# Patient Record
Sex: Female | Born: 1945 | Race: White | Hispanic: No | Smoking: Never smoker
Health system: Southern US, Community
[De-identification: ages and names within clinical notes are randomized; demographics above are authoritative.]

## PROBLEM LIST (undated history)

## (undated) DIAGNOSIS — I4892 Unspecified atrial flutter: Secondary | ICD-10-CM

## (undated) DIAGNOSIS — I480 Paroxysmal atrial fibrillation: Secondary | ICD-10-CM

## (undated) DIAGNOSIS — M109 Gout, unspecified: Secondary | ICD-10-CM

## (undated) DIAGNOSIS — I10 Essential (primary) hypertension: Secondary | ICD-10-CM

## (undated) DIAGNOSIS — E559 Vitamin D deficiency, unspecified: Secondary | ICD-10-CM

## (undated) DIAGNOSIS — A630 Anogenital (venereal) warts: Secondary | ICD-10-CM

## (undated) HISTORY — DX: Paroxysmal atrial fibrillation: I48.0

## (undated) HISTORY — DX: Unspecified atrial flutter: I48.92

## (undated) HISTORY — DX: Gout, unspecified: M10.9

## (undated) HISTORY — DX: Vitamin D deficiency, unspecified: E55.9

## (undated) HISTORY — DX: Anogenital (venereal) warts: A63.0

## (undated) HISTORY — PX: TUBAL LIGATION: SHX77

## (undated) HISTORY — PX: TONSILLECTOMY: SUR1361

---

## 2000-02-17 ENCOUNTER — Encounter: Payer: Self-pay | Admitting: Obstetrics and Gynecology

## 2000-02-17 ENCOUNTER — Encounter: Admission: RE | Admit: 2000-02-17 | Discharge: 2000-02-17 | Payer: Self-pay | Admitting: Obstetrics and Gynecology

## 2000-08-29 DIAGNOSIS — A630 Anogenital (venereal) warts: Secondary | ICD-10-CM

## 2000-08-29 HISTORY — DX: Anogenital (venereal) warts: A63.0

## 2001-02-23 ENCOUNTER — Encounter: Payer: Self-pay | Admitting: Obstetrics and Gynecology

## 2001-02-23 ENCOUNTER — Encounter: Admission: RE | Admit: 2001-02-23 | Discharge: 2001-02-23 | Payer: Self-pay | Admitting: Obstetrics and Gynecology

## 2001-07-20 ENCOUNTER — Ambulatory Visit (HOSPITAL_BASED_OUTPATIENT_CLINIC_OR_DEPARTMENT_OTHER): Admission: RE | Admit: 2001-07-20 | Discharge: 2001-07-20 | Payer: Self-pay | Admitting: Obstetrics and Gynecology

## 2002-03-12 ENCOUNTER — Encounter: Payer: Self-pay | Admitting: Obstetrics and Gynecology

## 2002-03-12 ENCOUNTER — Encounter: Admission: RE | Admit: 2002-03-12 | Discharge: 2002-03-12 | Payer: Self-pay | Admitting: Obstetrics and Gynecology

## 2003-03-14 ENCOUNTER — Encounter: Payer: Self-pay | Admitting: Family Medicine

## 2003-03-14 ENCOUNTER — Encounter: Admission: RE | Admit: 2003-03-14 | Discharge: 2003-03-14 | Payer: Self-pay | Admitting: Family Medicine

## 2003-10-24 ENCOUNTER — Ambulatory Visit (HOSPITAL_COMMUNITY): Admission: RE | Admit: 2003-10-24 | Discharge: 2003-10-24 | Payer: Self-pay | Admitting: Obstetrics and Gynecology

## 2008-04-18 DIAGNOSIS — E559 Vitamin D deficiency, unspecified: Secondary | ICD-10-CM

## 2008-04-18 HISTORY — DX: Vitamin D deficiency, unspecified: E55.9

## 2011-11-21 ENCOUNTER — Emergency Department (HOSPITAL_COMMUNITY)
Admission: EM | Admit: 2011-11-21 | Discharge: 2011-11-21 | Disposition: A | Discharge status: Home / Self Care | Payer: BC Managed Care – PPO | Attending: Emergency Medicine | Admitting: Emergency Medicine

## 2011-11-21 ENCOUNTER — Emergency Department (HOSPITAL_COMMUNITY): Payer: BC Managed Care – PPO

## 2011-11-21 ENCOUNTER — Encounter (HOSPITAL_COMMUNITY): Payer: Self-pay | Admitting: *Deleted

## 2011-11-21 ENCOUNTER — Other Ambulatory Visit: Payer: Self-pay

## 2011-11-21 DIAGNOSIS — M549 Dorsalgia, unspecified: Secondary | ICD-10-CM | POA: Insufficient documentation

## 2011-11-21 DIAGNOSIS — R079 Chest pain, unspecified: Secondary | ICD-10-CM | POA: Insufficient documentation

## 2011-11-21 DIAGNOSIS — I10 Essential (primary) hypertension: Secondary | ICD-10-CM | POA: Insufficient documentation

## 2011-11-21 DIAGNOSIS — R0602 Shortness of breath: Secondary | ICD-10-CM | POA: Insufficient documentation

## 2011-11-21 HISTORY — DX: Essential (primary) hypertension: I10

## 2011-11-21 LAB — BASIC METABOLIC PANEL
Calcium: 9.3 mg/dL (ref 8.4–10.5)
Chloride: 106 mEq/L (ref 96–112)
Creatinine, Ser: 0.78 mg/dL (ref 0.50–1.10)
GFR calc Af Amer: >90 mL/min (ref >90)
Sodium: 143 mEq/L (ref 135–145)

## 2011-11-21 LAB — D-DIMER, QUANTITATIVE: D-Dimer, Quant: 0.38 ug/mL-FEU (ref 0.00–0.48)

## 2011-11-21 LAB — CBC
Hemoglobin: 12.8 g/dL (ref 12.0–15.0)
MCHC: 32.7 g/dL (ref 30.0–36.0)
WBC: 8.8 10*3/uL (ref 4.0–10.5)

## 2011-11-21 LAB — TROPONIN I: Troponin I: <0.3 ng/mL (ref <0.30)

## 2011-11-21 MED ORDER — ASPIRIN 325 MG PO TABS
325.0000 mg | ORAL_TABLET | Freq: Once | ORAL | Status: AC
  Administered 2011-11-21: 325 mg via ORAL
  Filled 2011-11-21: qty 1

## 2011-11-21 MED ORDER — IOHEXOL 350 MG/ML SOLN
100.0000 mL | Freq: Once | INTRAVENOUS | Status: AC | PRN
  Administered 2011-11-21: 100 mL via INTRAVENOUS

## 2011-11-21 MED ORDER — SODIUM CHLORIDE 0.9 % IV SOLN
INTRAVENOUS | Status: DC
  Administered 2011-11-21: 18:00:00 via INTRAVENOUS

## 2011-11-21 MED ORDER — HYDROCODONE-ACETAMINOPHEN 5-325 MG PO TABS: 1.0000 | ORAL_TABLET | Freq: Four times a day (QID) | ORAL | Status: AC | PRN

## 2011-11-21 NOTE — Discharge Instructions (Signed)
Chest pain workup negative cardiac marker negative EKG without acute changes chest x-ray without pneumonia or pneumothorax CT angiogram without evidence of pulmonary embolism. Recommend starting a baby aspirin a day and further followup for the chest pain with your primary care doctor in the next few days. Return for new worse symptoms.Aspirin and Your Heart Aspirin affects the way your blood clots and helps "thin" the blood. Aspirin has many uses in heart disease. It may be used as a primary prevention to help reduce the risk of heart related events. It also can be used as a secondary measure to prevent more heart attacks or to prevent additional damage from blood clots.  ASPIRIN MAY HELP IF YOU:  Have had a heart attack or chest pain.   Have undergone open heart surgery such as CABG (Coronary Artery Bypass Surgery).   Have had coronary angioplasty with or without stents.   Have experienced a stroke or TIA (transient ischemic attack).   Have peripheral vascular disease (PAD).   Have chronic heart rhythm problems such as atrial fibrillation.   Are at risk for heart disease.  BEFORE STARTING ASPIRIN Before you start taking aspirin, your caregiver will need to review your medical history. Many things will need to be taken into consideration, such as:  Smoking status.   Blood pressure.   Diabetes.   Gender.   Weight.   Cholesterol level.  ASPIRIN DOSES  Aspirin should only be taken on the advice of your caregiver. Talk to your caregiver about how much aspirin you should take. Aspirin comes in different doses such as:   81 mg.   162 mg.   325 mg.   The aspirin dose you take may be affected by many factors, some of which include:   Your current medications, especially if your are taking blood-thinners or anti-platelet medicine.   Liver function.   Heart disease risk.   Age.   Aspirin comes in two forms:   Non-enteric-coated. This type of aspirin does not have a coating  and is absorbed faster. Non-enteric coated aspirin is recommended for patients experiencing chest pain symptoms. This type of aspirin also comes in a chewable form.   Enteric-coated. This means the aspirin has a special coating that releases the medicine very slowly. Enteric-coated aspirin causes less stomach upset. This type of aspirin should not be chewed or crushed.  ASPIRIN SIDE EFFECTS Daily use of aspirin can increase your risk of serious side effects, some of these include:  Increased bleeding. This can range from a cut that does not stop bleeding to more serious problems such as stomach bleeding or bleeding into the brain (Intracerebral bleeding).   Increased bruising.   Stomach upset.   An allergic reaction such as red, itchy skin.   Increased risk of bleeding when combined with non-steroidal anti-inflammatory medicine (NSAIDS).   Alcohol should be drank in moderation when taking aspirin. Alcohol can increase the risk of stomach bleeding when taken with aspirin.   Aspirin should not be given to children less than 16 years of age due to the association of Reye syndrome. Reye syndrome is a serious illness that can affect the brain and liver. Studies have linked Reye syndrome with aspirin use in children.   People that have nasal polyps have an increased risk of developing an aspirin allergy.  SEEK MEDICAL CARE IF:   You develop an allergic reaction such as:   Hives.   Itchy skin.   Swelling of the lips, tongue or face.  You develop stomach pain.   You have unusual bleeding or bruising.   You have ringing in your ears.  SEEK IMMEDIATE MEDICAL CARE IF:   You have severe chest pain, especially if the pain is crushing or pressure-like and spreads to the arms, back, neck, or jaw. THIS IS AN EMERGENCY. Do not wait to see if the pain will go away. Get medical help at once. Call your local emergency services (911 in the [place] DO NOT drive yourself to the hospital.   You have  stroke-like symptoms such as:   Loss of vision.   Difficulty talking.   Numbness or weakness on one side of your body.   Numbness or weakness in your arm or leg.   Not thinking clearly or feeling confused.   Your bowel movements are bloody, dark red or black in color.   You vomit or cough up blood.   You have blood in your urine.   You have shortness of breath, coughing or wheezing.  MAKE SURE YOU:   Understand these instructions.   Will monitor your condition.   Seek immediate medical care if necessary.  Document Released: 07/28/2008 Document Revised: 08/04/2011 Document Reviewed: 07/28/2008 [facility] Patient Information 2012 [place], [place].

## 2011-11-21 NOTE — ED Notes (Signed)
Pt presents to er with mid center chest pain that radiates through to the back area at times. Pain is associated with sob, pain is also increased with deep breathing. Denies any n/v, diaphoresis.

## 2011-11-21 NOTE — ED Provider Notes (Signed)
History   This chart was scribed for Shelda Jakes, MD by Sofie Rower. The patient was seen in room APA14/APA14 and the patient's care was started at 5:08PM.    CSN: [id number]  Arrival date & time 11/21/11  1644   First MD Initiated Contact with Patient 11/21/11 1701      Chief Complaint  Patient presents with  . Chest Pain  . Shortness of Breath    (Consider location/radiation/quality/duration/timing/severity/associated sxs/prior treatment) HPI  Paige Martinez is a 66 y.o. female who presents to the Emergency Department complaining of moderate, constant chest pain located sub sternally left onset yesterday with associated symptoms of shortness of breath, headache, cough, lower back pain. Pt states "pain radiates to the back". Pt rates the pain at 10/10 at worst, 2/10 at present. Modifying factors include deep breathing, lying on her back which intensifies the pain. Pt has a hx of irregular heartbeat (October 2012), hx of bronchitis which the pt just finished antibiotics.    Pt denies radiating pain to the neck or jaw, nausea, vomiting, any similar symptoms previously, fever, neck pain, dysuria, swelling in the legs, rash.   PCP is [facility].   Past Medical History  Diagnosis Date  . Hypertension     Past Surgical History  Procedure Date  . Tubal ligation     No family history on file.  History  Substance Use Topics  . Smoking status: Never Smoker   . Smokeless tobacco: Not on file  . Alcohol Use: No    OB History    Grav Para Term Preterm Abortions TAB SAB Ect Mult Living                  Review of Systems  All other systems reviewed and are negative.    10 Systems reviewed and are negative for acute change except as noted in the HPI.   Allergies  Review of patient's allergies indicates no known allergies.  Home Medications   Current Outpatient Rx  Name Route Sig Dispense Refill  . VITAMIN D 2000 UNITS PO CAPS Oral Take 1 capsule by mouth  every morning.    Marland Kitchen LISINOPRIL 10 MG PO TABS Oral Take 10 mg by mouth every morning.      BP 146/74  Pulse 62  Temp(Src) 98.1 F (36.7 C) (Oral)  Resp 18  Ht 5' 10.5" (1.791 m)  Wt 185 lb (83.915 kg)  BMI 26.17 kg/m2  SpO2 96%  Physical Exam  Nursing note and vitals reviewed. Constitutional: She is oriented to person, place, and time. She appears well-developed and well-nourished.  HENT:  Head: Normocephalic and atraumatic.  Right Ear: External ear normal.  Left Ear: External ear normal.  Nose: Nose normal.  Eyes: Conjunctivae and EOM are normal. No scleral icterus.  Neck: Neck supple. No thyromegaly present.  Cardiovascular: Normal rate, regular rhythm and normal heart sounds.  Exam reveals no gallop and no friction rub.   No murmur heard. Pulmonary/Chest: Breath sounds normal. No stridor. She has no wheezes. She has no rales. She exhibits no tenderness.  Abdominal: Soft. Bowel sounds are normal. She exhibits no distension. There is no tenderness. There is no rebound.  Musculoskeletal: Normal range of motion. She exhibits no edema.  Lymphadenopathy:    She has no cervical adenopathy.  Neurological: She is oriented to person, place, and time. Coordination normal.  Skin: Skin is warm and dry. No rash noted. No erythema.  Psychiatric: She has a normal mood and  affect. Her behavior is normal.    ED Course  Procedures (including critical care time)  DIAGNOSTIC STUDIES: Oxygen Saturation is 96% on room air, normal by my interpretation.    COORDINATION OF CARE:   Date: 11/21/2011  Rate: 59  Rhythm: sinus bradycardia  QRS Axis: normal  Intervals: normal  ST/T Wave abnormalities: nonspecific ST changes  Conduction Disutrbances:none  Narrative Interpretation:   Old EKG Reviewed: unchanged EKG is unchanged from 07/20/2001  Results for orders placed during the hospital encounter of 11/21/11  CBC      Component Value Range   WBC 8.8  4.0 - 10.5 (K/uL)   RBC 4.46  3.87 -  5.11 (MIL/uL)   Hemoglobin 12.8  12.0 - 15.0 (g/dL)   HCT 16.1  09.6 - 04.5 (%)   MCV 87.9  78.0 - 100.0 (fL)   MCH 28.7  26.0 - 34.0 (pg)   MCHC 32.7  30.0 - 36.0 (g/dL)   RDW 40.9  81.1 - 91.4 (%)   Platelets 279  150 - 400 (K/uL)  TROPONIN I      Component Value Range   Troponin I <0.30  <0.30 (ng/mL)  D-DIMER, QUANTITATIVE      Component Value Range   D-Dimer, Quant 0.38  0.00 - 0.48 (ug/mL-FEU)  BASIC METABOLIC PANEL      Component Value Range   Sodium 143  135 - 145 (mEq/L)   Potassium 3.7  3.5 - 5.1 (mEq/L)   Chloride 106  96 - 112 (mEq/L)   CO2 28  19 - 32 (mEq/L)   Glucose, Bld 97  70 - 99 (mg/dL)   BUN 11  6 - 23 (mg/dL)   Creatinine, Ser 7.82  0.50 - 1.10 (mg/dL)   Calcium 9.3  8.4 - 95.6 (mg/dL)   GFR calc non Af Amer 86 (*) >90 (mL/min)   GFR calc Af Amer >90  >90 (mL/min)   Dg Chest 2 View  11/21/2011  *RADIOLOGY REPORT*  Clinical Data: Chest pain and shortness of breath  CHEST - 2 VIEW  Comparison: None  Findings: Heart size is normal.  No pleural effusion or pulmonary edema.  No airspace consolidation.  Scar versus plate-like atelectasis is noted in the left base.  IMPRESSION:  1.  Left base scar versus atelectasis. 2.  No pneumonia.  Original Report Authenticated By: Rosealee Albee, M.D.   Ct Angio Chest W/cm &/or Wo Cm  11/21/2011  *RADIOLOGY REPORT*  Clinical Data: Constant, substernal chest pain since yesterday. Shortness of breath.  Pain radiating into the back.  Hypertension.  CT ANGIOGRAPHY CHEST  Technique:  Multidetector CT imaging of the chest using the standard protocol during bolus administration of intravenous contrast. Multiplanar reconstructed images including MIPs were obtained and reviewed to evaluate the vascular anatomy.  Contrast: OMNIPAQUE IOHEXOL 350 MG/ML IV SOLN  Comparison: Chest x-ray 11/21/2011  Findings: Pulmonary arteries are well opacified.  There is no evidence for acute pulmonary embolus.  The heart size is normal. No mediastinal,  hilar, or axillary adenopathy.  The visualized portion of the thyroid gland has a normal appearance.  No pulmonary nodules, pleural effusions, or infiltrates.  Benign right adrenal myelolipoma or adenoma identified on the upper the cuts of the abdomen. Visualized osseous structures have a normal appearance.  IMPRESSION:  1.  Technically adequate exam showing no evidence for acute pulmonary embolus. 2.  Incidental note of a benign right adrenal adenoma or myelolipoma.  Original Report Authenticated By: Patterson Hammersmith, M.D.  1. Chest pain     5:12PM- EDP at bedside discusses treatment plan.   6:54PM- EDP at bedside discusses treatment plan.   MDM   Workup for chest pain in the emergency department without any sniffing findings EKG without acute cardiac changes troponin negative patient's chest pain started yesterday. CT angiogram of the chest pain radiated to the back that's negative for any abnormalities findings no evidence of an embolism regular x-ray negative for pneumonia or pneumothorax as well. Should etiology of chest pain is not clear but it is chest wall or pleuritic in nature. We'll recommend a baby aspirin a day and will treat patient with hydrocodone as needed for more severe pain however pain has been improving here. Patient will followup with her primary care doctor in the next few days.        I personally performed the services described in this documentation, which was scribed in my presence. The recorded information has been reviewed and considered.        Shelda Jakes, MD 11/21/11 2030

## 2013-06-05 ENCOUNTER — Encounter: Payer: Self-pay | Admitting: Family Medicine

## 2013-06-27 ENCOUNTER — Ambulatory Visit (INDEPENDENT_AMBULATORY_CARE_PROVIDER_SITE_OTHER): Payer: BC Managed Care – PPO | Admitting: Physician Assistant

## 2013-06-27 ENCOUNTER — Telehealth: Payer: Self-pay | Admitting: Physician Assistant

## 2013-06-27 ENCOUNTER — Other Ambulatory Visit: Payer: Self-pay | Admitting: Physician Assistant

## 2013-06-27 ENCOUNTER — Encounter: Payer: Self-pay | Admitting: Physician Assistant

## 2013-06-27 VITALS — BP 122/80 | HR 60 | Temp 97.7°F | Resp 18 | Ht 69.0 in | Wt 191.0 lb

## 2013-06-27 DIAGNOSIS — Z0000 Encounter for general adult medical examination without abnormal findings: Secondary | ICD-10-CM

## 2013-06-27 DIAGNOSIS — E559 Vitamin D deficiency, unspecified: Secondary | ICD-10-CM

## 2013-06-27 DIAGNOSIS — Z23 Encounter for immunization: Secondary | ICD-10-CM

## 2013-06-27 DIAGNOSIS — I10 Essential (primary) hypertension: Secondary | ICD-10-CM

## 2013-06-27 DIAGNOSIS — Z1239 Encounter for other screening for malignant neoplasm of breast: Secondary | ICD-10-CM

## 2013-06-27 DIAGNOSIS — Z01419 Encounter for gynecological examination (general) (routine) without abnormal findings: Secondary | ICD-10-CM

## 2013-06-27 LAB — BASIC METABOLIC PANEL WITH GFR
Calcium: 9.7 mg/dL (ref 8.4–10.5)
GFR, Est African American: >89 mL/min
Sodium: 141 mEq/L (ref 135–145)

## 2013-06-27 NOTE — Telephone Encounter (Signed)
Patient needs her Lisinopril refilled.    [facility]

## 2013-06-27 NOTE — Progress Notes (Signed)
Patient ID: Paige Martinez MRN: [id number], DOB: 10-15-1945, 67 y.o. Date of Encounter: 06/27/2013,   Chief Complaint: Physical (CPE) coded as a level IV office visit as patient is Medicare.  HPI: 67 y.o. y/o female  here for CPE.  Coded as a level IV office visit as patient is Medicare.  Patient states that she has been feeling well over the past year. She has not had to see any type of medical care for the entire year. She is taking her vitamin D 1000 units daily and her blood pressure pill as directed. She is having no adverse effects.   Review of Systems: Consitutional: No fever, chills, fatigue, night sweats, lymphadenopathy. No significant/unexplained weight changes. Eyes: No visual changes, eye redness, or discharge. ENT/Mouth: No ear pain, sore throat, nasal drainage, or sinus pain. Cardiovascular: No chest pressure,heaviness, tightness or squeezing, even with exertion. No increased shortness of breath or dyspnea on exertion.No palpitations, edema, orthopnea, PND. Respiratory: No cough, hemoptysis, SOB, or wheezing. Gastrointestinal: No anorexia, dysphagia, reflux, pain, nausea, vomiting, hematemesis, diarrhea, constipation, BRBPR, or melena. Breast: No mass, nodules, bulging, or retraction. No skin changes or inflammation. No nipple discharge. No lymphadenopathy. Genitourinary: No dysuria, hematuria, incontinence, vaginal discharge, pruritis, burning, abnormal bleeding, or pain. Musculoskeletal: No decreased ROM, No joint pain or swelling. No significant pain in neck, back, or extremities. Skin: No rash, pruritis, or concerning lesions. Neurological: No headache, dizziness, syncope, seizures, tremors, memory loss, coordination problems, or paresthesias. Psychological: No anxiety, depression, hallucinations, SI/HI. Endocrine: No polydipsia, polyphagia, polyuria, or known diabetes.No increased fatigue. No palpitations/rapid heart rate. No significant/unexplained weight  change. All other systems were reviewed and are otherwise negative.  Past Medical History  Diagnosis Date  . Hypertension   . Vitamin D deficiency 04/18/2008  . 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 Tubal ligation      Home Meds:  Current Outpatient Prescriptions on File Prior to Visit  Medication Sig Dispense Refill  . lisinopril (PRINIVIL,ZESTRIL) 10 MG tablet Take 10 mg by mouth every morning.       No current facility-administered medications on file prior to visit.    Allergies: No Known Allergies  History   Social History  . Marital Status: Divorced    Spouse Name: N/A    Number of Children: N/A  . 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 Smoking status: Never Smoker   . Smokeless tobacco: Not on file  . Alcohol Use: No  . Drug Use: No  . Sexual Activity: Not on file   Other Topics Concern  . Not on file   Social History Narrative   Divorced.    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   Rides Stationary Bike 4 miles 4 days per week. --20 minutes    Family History  Problem Relation Age of Onset  . Alcohol abuse Father   . Cancer Father 13    Pancreatic Cancer  . Hypertension Sister   . Hypertension Brother     Physical Exam: Blood pressure 122/80, pulse 60, temperature 97.7 F (36.5 C), temperature source Oral, resp. rate 18, height 5\' 9"  (1.753 m), weight 191 lb (86.637 kg)., Body mass index is 28.19 kg/(m^2). General: Overweight white female. Appears in no acute distress. Neck: Supple. Trachea midline. No thyromegaly. Full ROM.  No lymphadenopathy.No Carotid Bruits. Lungs: Clear to auscultation bilaterally without wheezes, rales, or rhonchi. Breathing is of normal effort and unlabored. Cardiovascular: RRR with S1 S2. No murmurs, rubs, or gallops. Distal pulses 2+ symmetrically. No carotid or  abdominal bruits. Breast: Symmetrical. No masses. Nipples without discharge. Abdomen: Soft, non-tender, non-distended with normoactive bowel sounds. No hepatosplenomegaly or masses. No rebound/guarding. No CVA tenderness. No hernias.  Genitourinary:  External genitalia without lesions. Vaginal mucosa pink.No discharge present. Cervix pink and without discharge. No cervical tenderness.Normal uterus size. No adnexal mass or tenderness.  Musculoskeletal: Full range of motion and 5/5 strength throughout. Without swelling, atrophy, tenderness, crepitus, or warmth. Extremities without clubbing, cyanosis, or edema. Calves supple. Skin: Warm and moist without erythema, ecchymosis, wounds, or rash. Neuro: A+Ox3. CN II-XII grossly intact. Moves all extremities spontaneously. Full sensation throughout. Normal gait. Psych:  Responds to questions appropriately with a normal affect.   Assessment/Plan:  67 y.o. y/o female here for CPE 1. Hypertension Blood pressure at goal at 122/80 today. Cont current  medication and check labs monitor. - BASIC METABOLIC PANEL WITH GFR  2. Vitamin D deficiency History of vitamin D deficiency. 04/18/2008 vitamin D level was 17.8. She has been treated with supplementation since that time. Currently taking 1000 units daily. We'll recheck level to monitor. Last several checks have been within normal limits on this dose. - Vit D  25 hydroxy (rtn osteoporosis monitoring)  3. Screening breast examination, BSE discussion Discussed monthly self examination to check for any changes in self exam. Discussed abnormal findings to look for.  4. Breast cancer screening She reports that she had routine mammogram October 2013. At that mammogram she was told to followup in 6 months. However she did not follow up until September 2014. Says at that time she had an additional ultrasound as well as 3-D mammogram. She states she was told that the prior spot they were looking at had not changed.  However they saw a new spot on this one so they recommended repeat exam in 6 months again. However she says that they suspected that this areas of concern are most likely  cysts.  5. Visit for pelvic exam Pelvic exam is normal. She had a Pap smear October 2013 which was normal. Therefore repeat is not due yet.   A. Screening Labs: She had a full panel of screening lab work October 2013 which was all normal. Cholesterol was good. Therefore not recheck these labs.  B. Pap: Last Pap was October 2013 was normal.  C. Screening Mammogram: See #4 above.  E. Colorectal Cancer Screening: She had colonoscopy 2009. Normal. Repeat 10 years. Due to repeat 2019.  F. Immunizations:  Influenza: She reports that she already had one for this year on 05/29/2013. Tetanus: This is over due and she is agreeable to have this done today. Update now. Pneumococcal: Administered here 04/17/2009. She will need no further pneumococcal vaccines. Zostavax: She received this 06/10/2011.  Signed, [address] [place], [place], [facility] 06/27/2013 9:03 AM

## 2013-06-27 NOTE — Addendum Note (Signed)
Addended by: Donne Anon on: 06/27/2013 09:31 AM   Modules accepted: Orders

## 2013-06-28 LAB — VITAMIN D 25 HYDROXY (VIT D DEFICIENCY, FRACTURES): Vit D, 25-Hydroxy: 64 ng/mL (ref 30–89)

## 2013-07-01 ENCOUNTER — Encounter: Payer: Self-pay | Admitting: Family Medicine

## 2013-07-02 ENCOUNTER — Other Ambulatory Visit: Payer: Self-pay | Admitting: Physician Assistant

## 2013-07-02 IMAGING — CT CT ANGIO CHEST
2 of 6 series · 6 of 36 positions shown · IV contrast (Omnipaque 300)
Comparison: Chest x-ray 11/21/2011

CLINICAL DATA: Constant, substernal chest pain since yesterday.
Shortness of breath.  Pain radiating into the back.  Hypertension.

CT ANGIOGRAPHY CHEST
TECHNIQUE: Multidetector CT imaging of the chest using the
standard protocol during bolus administration of intravenous
contrast. Multiplanar reconstructed images including MIPs were
obtained and reviewed to evaluate the vascular anatomy.
Contrast: 100mL OMNIPAQUE IOHEXOL 350 MG/ML IV SOLN

[Series 4: pe 3.0 b40f · axial · 0.73mm/px · z∈[-268,-97]mm · 5 of 87 slices shown]
[im 15/87  lung]
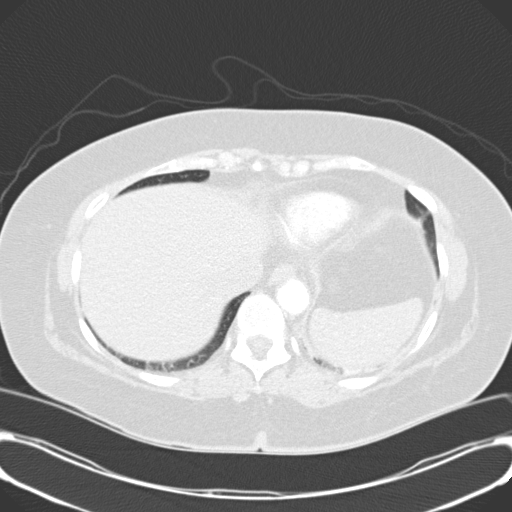
[im 29/87  mediastinal]
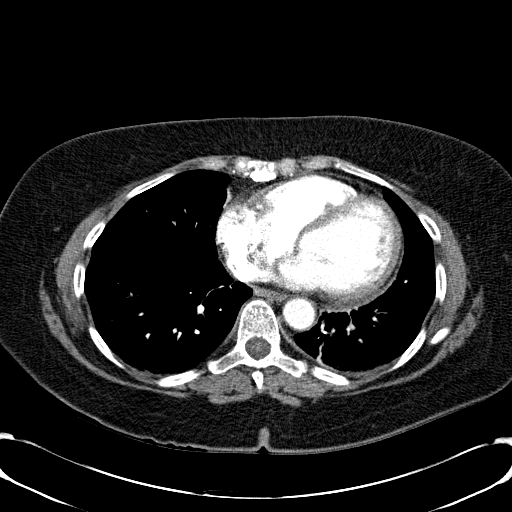
[im 44/87  lung]
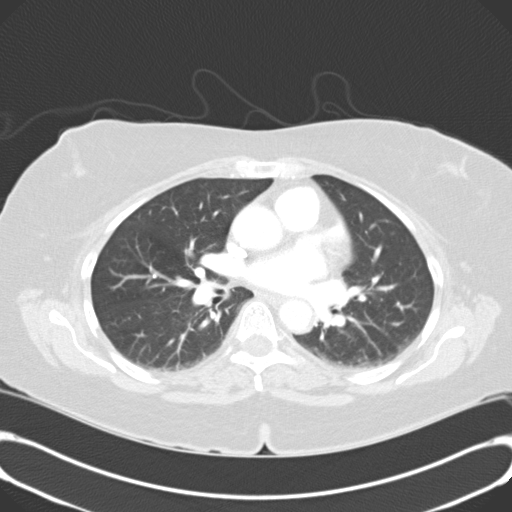
[im 58/87  mediastinal]
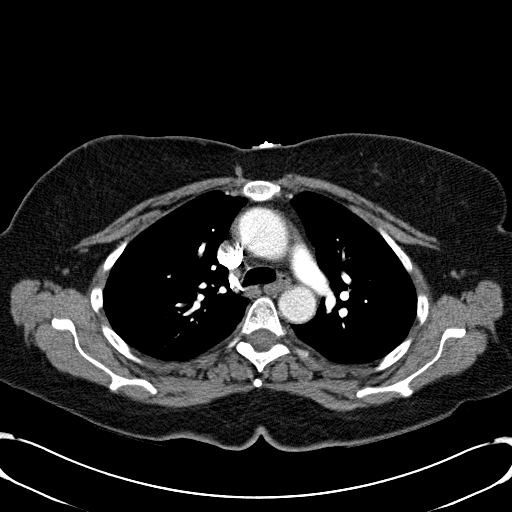
[im 72/87  lung]
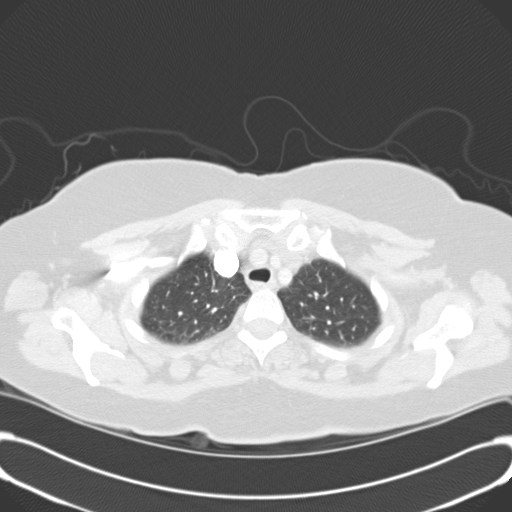

[Series 6: mpr coronal pe 3mm · coronal · 0.52mm/px · 1 of 83 slices shown]
[im 42/83  mediastinal]
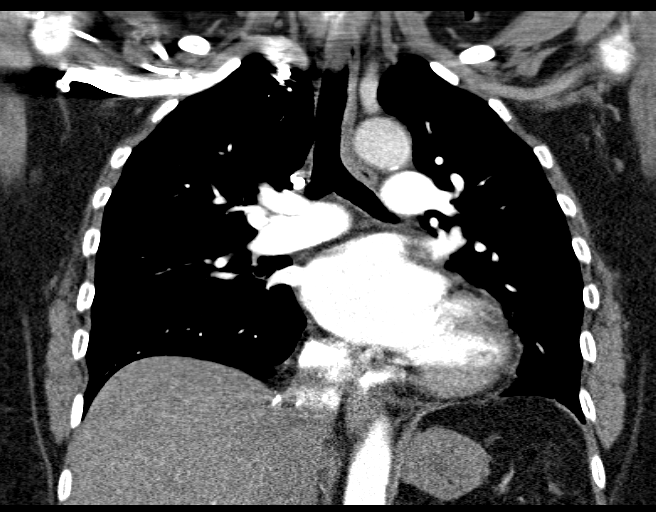

[6 of 36 positions shown; findings below may reference images not displayed]

FINDINGS: Pulmonary arteries are well opacified.  There is no
evidence for acute pulmonary embolus.  The heart size is normal. No
mediastinal, hilar, or axillary adenopathy.  The visualized portion
of the thyroid gland has a normal appearance.  No pulmonary
nodules, pleural effusions, or infiltrates.  Benign right adrenal
myelolipoma or adenoma identified on the upper the cuts of the
abdomen. Visualized osseous structures have a normal appearance.
IMPRESSION: 1.  Technically adequate exam showing no evidence for acute
pulmonary embolus.
2.  Incidental note of a benign right adrenal adenoma or
myelolipoma.

## 2013-07-02 IMAGING — CR DG CHEST 2V
2 series · 2 of 2 positions shown · non-contrast
Comparison: None

CLINICAL DATA: Chest pain and shortness of breath

CHEST - 2 VIEW

[view not recorded (1 of 2)]
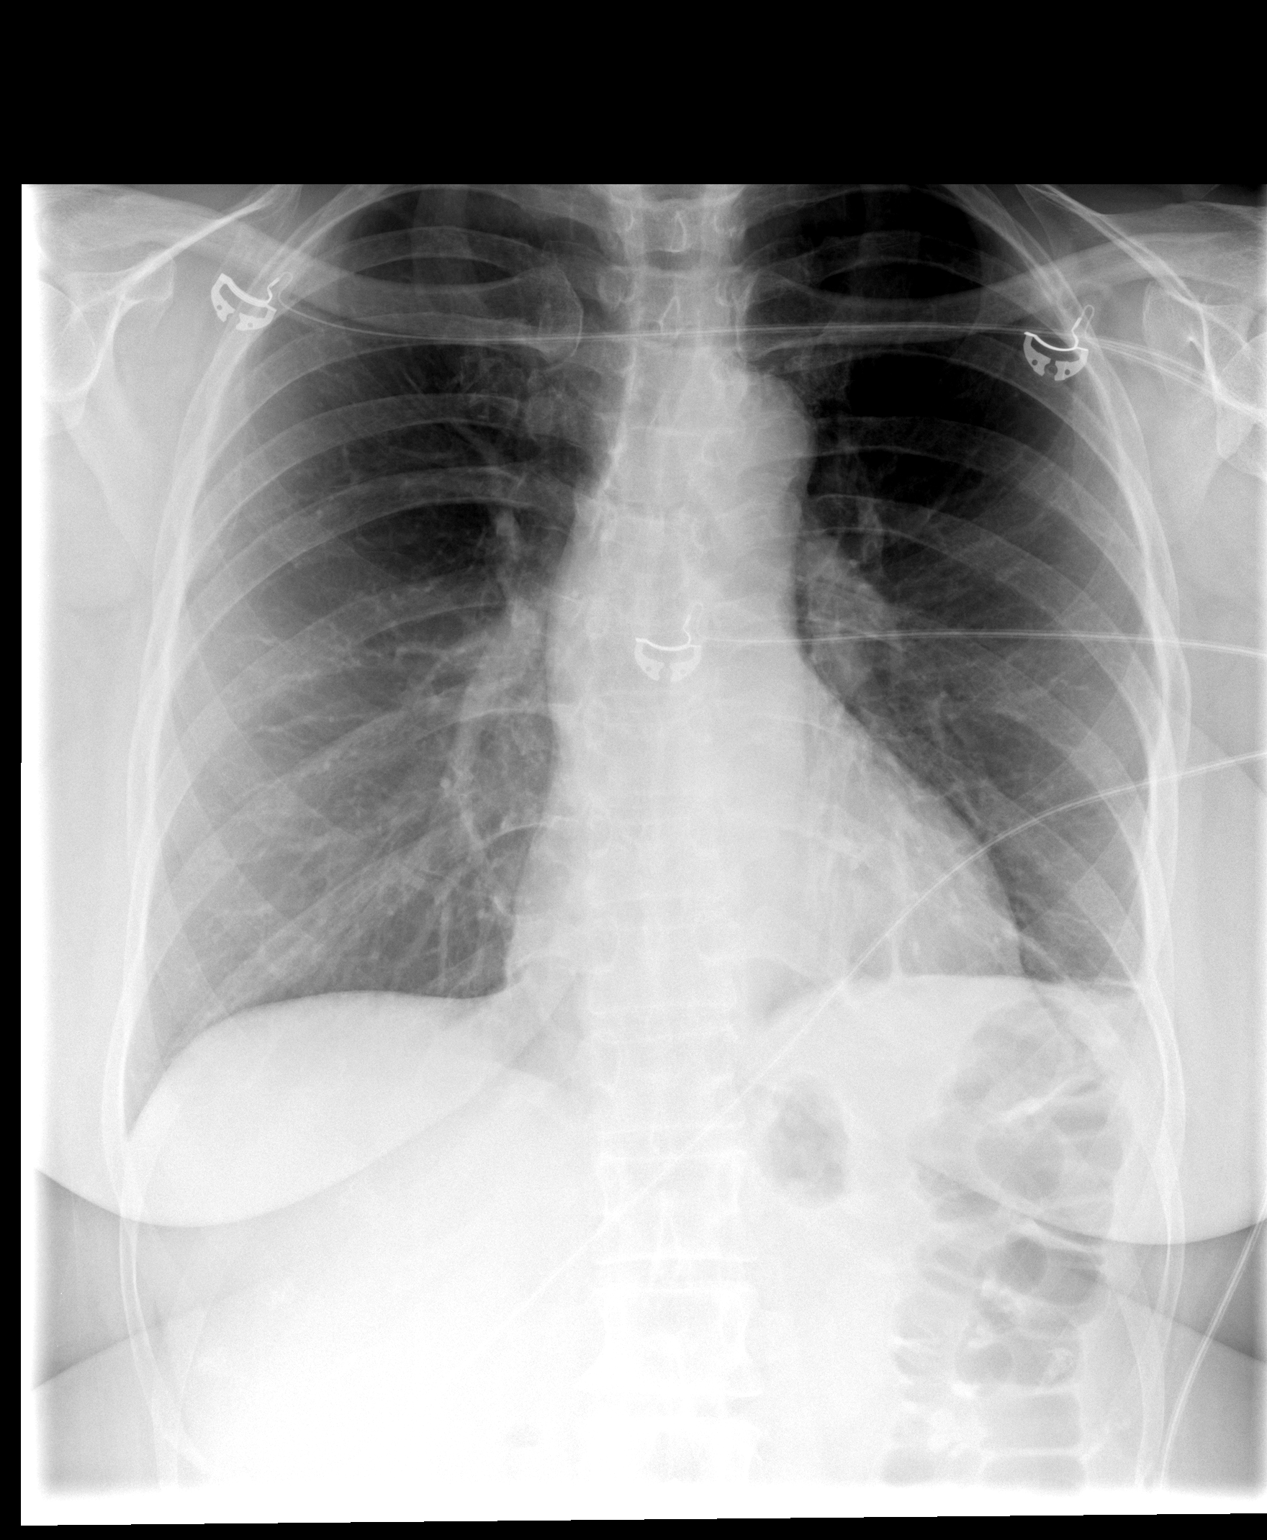

[view not recorded (2 of 2)]
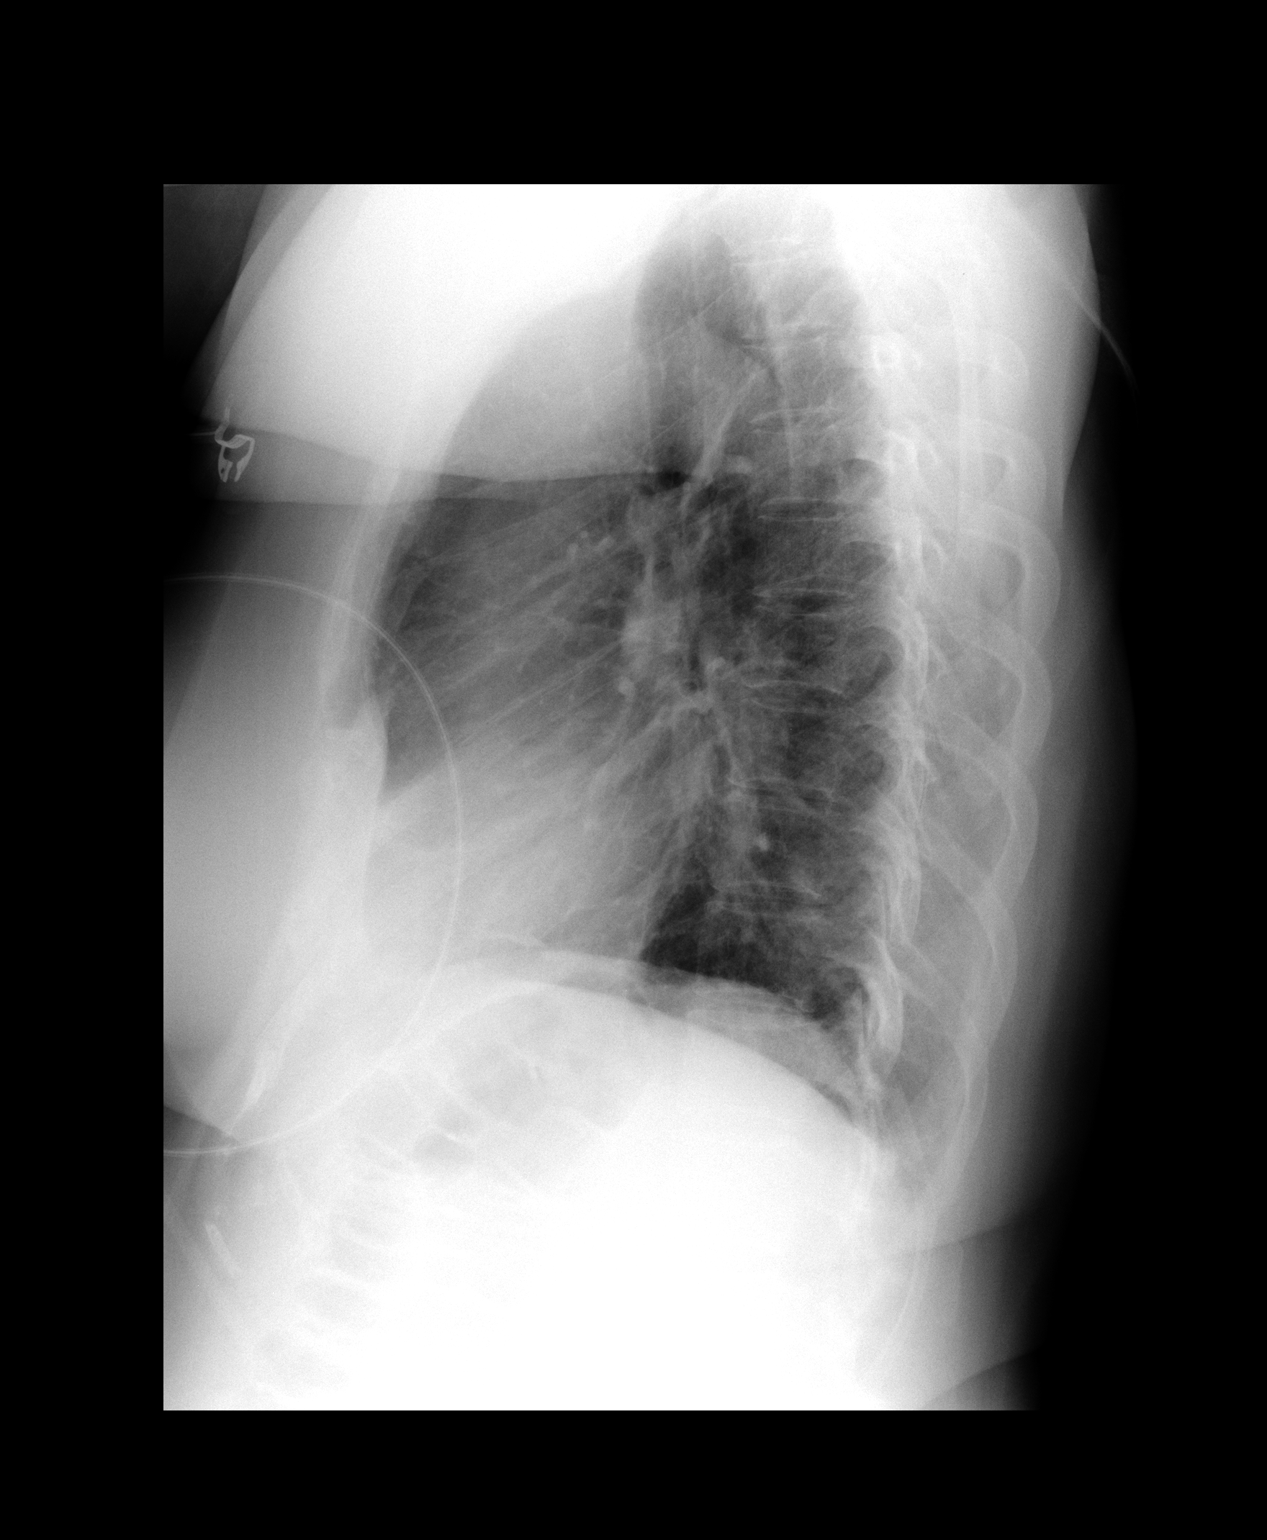

[2 of 2 positions shown; findings below may reference images not displayed]

FINDINGS: Heart size is normal.

No pleural effusion or pulmonary edema.

No airspace consolidation.

Scar versus plate-like atelectasis is noted in the left base.
IMPRESSION: 1.  Left base scar versus atelectasis.
2.  No pneumonia.

## 2013-07-03 NOTE — Telephone Encounter (Signed)
Medication refilled per protocol. 

## 2013-11-15 DIAGNOSIS — N6009 Solitary cyst of unspecified breast: Secondary | ICD-10-CM | POA: Diagnosis not present

## 2013-12-05 ENCOUNTER — Encounter: Payer: Self-pay | Admitting: Family Medicine

## 2013-12-13 DIAGNOSIS — M722 Plantar fascial fibromatosis: Secondary | ICD-10-CM | POA: Diagnosis not present

## 2014-03-25 DIAGNOSIS — M722 Plantar fascial fibromatosis: Secondary | ICD-10-CM | POA: Diagnosis not present

## 2014-03-25 DIAGNOSIS — M7150 Other bursitis, not elsewhere classified, unspecified site: Secondary | ICD-10-CM | POA: Diagnosis not present

## 2014-03-25 DIAGNOSIS — M7730 Calcaneal spur, unspecified foot: Secondary | ICD-10-CM | POA: Diagnosis not present

## 2014-04-01 DIAGNOSIS — M7150 Other bursitis, not elsewhere classified, unspecified site: Secondary | ICD-10-CM | POA: Diagnosis not present

## 2014-04-01 DIAGNOSIS — M722 Plantar fascial fibromatosis: Secondary | ICD-10-CM | POA: Diagnosis not present

## 2014-06-02 ENCOUNTER — Other Ambulatory Visit: Payer: Medicare Other

## 2014-06-02 DIAGNOSIS — E559 Vitamin D deficiency, unspecified: Secondary | ICD-10-CM | POA: Diagnosis not present

## 2014-06-02 DIAGNOSIS — Z79899 Other long term (current) drug therapy: Secondary | ICD-10-CM

## 2014-06-02 DIAGNOSIS — I10 Essential (primary) hypertension: Secondary | ICD-10-CM | POA: Diagnosis not present

## 2014-06-02 DIAGNOSIS — M8580 Other specified disorders of bone density and structure, unspecified site: Secondary | ICD-10-CM

## 2014-06-02 LAB — COMPLETE METABOLIC PANEL WITH GFR
ALBUMIN: 4.1 g/dL (ref 3.5–5.2)
ALT: 12 U/L (ref 0–35)
AST: 16 U/L (ref 0–37)
Alkaline Phosphatase: 114 U/L (ref 39–117)
BUN: 13 mg/dL (ref 6–23)
CALCIUM: 9.4 mg/dL (ref 8.4–10.5)
CHLORIDE: 105 meq/L (ref 96–112)
CO2: 29 meq/L (ref 19–32)
CREATININE: 0.79 mg/dL (ref 0.50–1.10)
GFR, EST AFRICAN AMERICAN: 89 mL/min
GFR, EST NON AFRICAN AMERICAN: 77 mL/min
GLUCOSE: 91 mg/dL (ref 70–99)
POTASSIUM: 4.5 meq/L (ref 3.5–5.3)
Sodium: 141 mEq/L (ref 135–145)
Total Bilirubin: 0.5 mg/dL (ref 0.2–1.2)
Total Protein: 6.2 g/dL (ref 6.0–8.3)

## 2014-06-02 LAB — LIPID PANEL
CHOL/HDL RATIO: 3.1 ratio
CHOLESTEROL: 205 mg/dL — AB (ref 0–200)
HDL: 66 mg/dL (ref >39)
LDL Cholesterol: 122 mg/dL — ABNORMAL HIGH (ref 0–99)
Triglycerides: 85 mg/dL (ref <150)
VLDL: 17 mg/dL (ref 0–40)

## 2014-06-02 LAB — CBC WITH DIFFERENTIAL/PLATELET
BASOS ABS: 0 10*3/uL (ref 0.0–0.1)
Basophils Relative: 0 % (ref 0–1)
EOS PCT: 2 % (ref 0–5)
Eosinophils Absolute: 0.2 10*3/uL (ref 0.0–0.7)
HCT: 39.7 % (ref 36.0–46.0)
Hemoglobin: 13.5 g/dL (ref 12.0–15.0)
LYMPHS ABS: 1.4 10*3/uL (ref 0.7–4.0)
LYMPHS PCT: 18 % (ref 12–46)
MCH: 28.8 pg (ref 26.0–34.0)
MCHC: 34 g/dL (ref 30.0–36.0)
MCV: 84.6 fL (ref 78.0–100.0)
Monocytes Absolute: 0.5 10*3/uL (ref 0.1–1.0)
Monocytes Relative: 7 % (ref 3–12)
NEUTROS PCT: 73 % (ref 43–77)
Neutro Abs: 5.5 10*3/uL (ref 1.7–7.7)
PLATELETS: 306 10*3/uL (ref 150–400)
RBC: 4.69 MIL/uL (ref 3.87–5.11)
RDW: 14.5 % (ref 11.5–15.5)
WBC: 7.5 10*3/uL (ref 4.0–10.5)

## 2014-06-02 LAB — TSH: TSH: 2.874 u[IU]/mL (ref 0.350–4.500)

## 2014-06-03 LAB — VITAMIN D 25 HYDROXY (VIT D DEFICIENCY, FRACTURES): VIT D 25 HYDROXY: 61 ng/mL (ref 30–89)

## 2014-06-05 ENCOUNTER — Ambulatory Visit (INDEPENDENT_AMBULATORY_CARE_PROVIDER_SITE_OTHER): Payer: Medicare Other | Admitting: Physician Assistant

## 2014-06-05 ENCOUNTER — Encounter: Payer: Self-pay | Admitting: Physician Assistant

## 2014-06-05 VITALS — BP 120/80 | HR 52 | Temp 98.3°F | Resp 18 | Ht 69.25 in | Wt 194.0 lb

## 2014-06-05 DIAGNOSIS — I10 Essential (primary) hypertension: Secondary | ICD-10-CM

## 2014-06-05 DIAGNOSIS — Z23 Encounter for immunization: Secondary | ICD-10-CM

## 2014-06-05 DIAGNOSIS — E559 Vitamin D deficiency, unspecified: Secondary | ICD-10-CM

## 2014-06-05 DIAGNOSIS — Z0000 Encounter for general adult medical examination without abnormal findings: Secondary | ICD-10-CM

## 2014-06-05 MED ORDER — LISINOPRIL 10 MG PO TABS: ORAL_TABLET | ORAL | Status: DC

## 2014-06-05 NOTE — Progress Notes (Signed)
Subjective:   Patient presents for Welcome to Physical.  Review Past Medical/Family/Social: These are all documented in Epic and reviewed today.  Risk Factors  Current exercise habits:  Rides stationary bike 4 miles 4 times a week Dietary issues discussed: Discussed lower fat, lower carb diet  Cardiac risk factors: Obesity, Age, HTN  Depression Screen  (Note: if answer to either of the following is "Yes", a more complete depression screening is indicated)  Over the past two weeks, have you felt down, depressed or hopeless? No Over the past two weeks, have you felt little interest or pleasure in doing things? No Have you lost interest or pleasure in daily life? No Do you often feel hopeless? No Do you cry easily over simple problems? No   Activities of Daily Living  In your present state of health, do you have any difficulty performing the following activities?:  Driving? No  Managing money? No  Feeding yourself? No  Getting from bed to chair? No  Climbing a flight of stairs? No  Preparing food and eating?: No  Bathing or showering? No  Getting dressed: No  Getting to the toilet? No  Using the toilet:No  Moving around from place to place: No  In the past year have you fallen or had a near fall?:No  Are you sexually active? No  Do you have more than one partner? No   Hearing Difficulties: No  Do you often ask people to speak up or repeat themselves? No  Do you experience ringing or noises in your ears? No Do you have difficulty understanding soft or whispered voices? No  Do you feel that you have a problem with memory? No Do you often misplace items? No  Do you feel safe at home? Yes  Cognitive Testing  Alert? Yes Normal Appearance?Yes  Oriented to person? Yes Place? Yes  Time? Yes  Recall of three objects? Yes  Can perform simple calculations? Yes  Displays appropriate judgment?Yes  Can read the correct time from a watch face?Yes   List the Names of Other  Physician/Practitioners you currently use:  None  Indicate any recent Medical Services you may have received from other than [facility] providers in the past year (date may be approximate).   Screening Tests / Date Colonoscopy---[facility]---2009--normal--repeat 10 years                     Zostavax ---06/10/2011 Mammogram ---Had U/S 10/2013--Was due for Bilateral Mammogram 05/18/2014--Pt agreeable to schedule herself--at [facility] Influenza Vaccine --Pt agreeable to receive today Tetanus/tdap---06/27/2013    Assessment:    Annual wellness medicare exam   Plan:    During the course of the visit the patient was educated and counseled about appropriate screening and preventive services including:  Screening mammography  Colorectal cancer screening  Shingles vaccine. Prescription given to that she can get the vaccine at the pharmacy or Medicare part D.  Screen + for depression. PHQ- 9 score of 12 (moderate depression). We discussed the options of counseling versus possibly a medication. I encouraged her strongly think about the counseling. She is going through some medical problems currently and her husband is as well Mrs. been very stressful for her. She says she will think about it. She does have Xanax to use as needed. Though she may benefit from an SSRI for her more depressive type symptoms but she wants to hold off at this time.  I aksed her to please have her cardioloist send records since we  have none on file.  Diet review for nutrition referral? Yes ____ Not Indicated __x__  Patient Instructions (the written plan) was given to the patient.  Medicare Attestation  I have personally reviewed:  The patient's medical and social history  Their use of alcohol, tobacco or illicit drugs  Their current medications and supplements  The patient's functional ability including ADLs,fall risks, home safety risks, cognitive, and hearing and visual impairment  Diet and physical activities  Evidence for  depression or mood disorders  The patient's weight, height, BMI, and visual acuity have been recorded in the chart. I have made referrals, counseling, and provided education to the patient based on review of the above and I have provided the patient with a written personalized care plan for preventive services.      Review of Systems: Consitutional: No fever, chills, fatigue, night sweats, lymphadenopathy. No significant/unexplained weight changes. Eyes: No visual changes, eye redness, or discharge. ENT/Mouth: No ear pain, sore throat, nasal drainage, or sinus pain. Cardiovascular: No chest pressure,heaviness, tightness or squeezing, even with exertion. No increased shortness of breath or dyspnea on exertion.No palpitations, edema, orthopnea, PND. Respiratory: No cough, hemoptysis, SOB, or wheezing. Gastrointestinal: No anorexia, dysphagia, reflux, pain, nausea, vomiting, hematemesis, diarrhea, constipation, BRBPR, or melena. Breast: No mass, nodules, bulging, or retraction. No skin changes or inflammation. No nipple discharge. No lymphadenopathy. Genitourinary: No dysuria, hematuria, incontinence, vaginal discharge, pruritis, burning, abnormal bleeding, or pain. Musculoskeletal: No decreased ROM, No joint pain or swelling. No significant pain in neck, back, or extremities. Skin: No rash, pruritis, or concerning lesions. Neurological: No headache, dizziness, syncope, seizures, tremors, memory loss, coordination problems, or paresthesias. Psychological: No anxiety, depression, hallucinations, SI/HI. Endocrine: No polydipsia, polyphagia, polyuria, or known diabetes.No increased fatigue. No palpitations/rapid heart rate. No significant/unexplained weight change. All other systems were reviewed and are otherwise negative.  Past Medical History  Diagnosis Date  . Hypertension   . Vitamin D deficiency 04/18/2008  . Venereal warts 08/29/2000     Past Surgical History  Procedure Laterality Date   . Tubal ligation      Home Meds:  Outpatient Prescriptions Prior to Visit  Medication Sig Dispense Refill  . cholecalciferol (VITAMIN D) 1000 UNITS tablet Take 1,000 Units by mouth daily.      Marland Kitchen lisinopril (PRINIVIL,ZESTRIL) 10 MG tablet TAKE ONE TABLET BY MOUTH EVERY DAY  30 tablet  5  . Multiple Vitamin (MULTIVITAMIN) tablet Take 1 tablet by mouth daily.       No facility-administered medications prior to visit.    Allergies: No Known Allergies  History   Social History  . Marital Status: Divorced    Spouse Name: N/A    Number of Children: N/A  . 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 Smoking status: Never Smoker   . Smokeless tobacco: Never Used  . Alcohol Use: No  . Drug Use: No  . Sexual Activity: Not on file   Other Topics Concern  . Not on file   Social History Narrative   Divorced.    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   Rides Stationary Bike 4 miles 4 days per week. --20 minutes    Family History  Problem Relation Age of Onset  . Alcohol abuse Father   . Cancer Father 56    Pancreatic Cancer  . Hypertension Sister   .  Hypertension Brother     Physical Exam: Blood pressure 120/80, pulse 52, temperature 98.3 F (36.8 C), temperature source Oral, resp. rate 18, height 5' 9.25" (1.759 m), weight 194 lb (87.998 kg)., Body mass index is 28.44 kg/(m^2). General: Obese WF. Appears in no acute distress. HEENT: Normocephalic, atraumatic. Conjunctiva pink, sclera non-icteric. Pupils 2 mm constricting to 1 mm, round, regular, and equally reactive to light and accomodation. EOMI. Internal auditory canal clear. TMs with good cone of light and without pathology. Nasal mucosa pink. Nares are without discharge. No sinus tenderness. Oral mucosa pink.  Pharynx without exudate.   Neck: Supple. Trachea midline. No thyromegaly. Full ROM. No lymphadenopathy.No  Carotid Bruits. Lungs: Clear to auscultation bilaterally without wheezes, rales, or rhonchi. Breathing is of normal effort and unlabored. Cardiovascular: RRR with S1 S2. No murmurs, rubs, or gallops. Distal pulses 2+ symmetrically. No carotid or abdominal bruits. Abdomen: Soft, non-tender, non-distended with normoactive bowel sounds. No hepatosplenomegaly or masses. No rebound/guarding. No CVA tenderness. No hernias.  Musculoskeletal: Full range of motion and 5/5 strength throughout. Without swelling, atrophy, tenderness, crepitus, or warmth. Extremities without clubbing, cyanosis, or edema. Calves supple. Skin: Warm and moist without erythema, ecchymosis, wounds, or rash. Neuro: A+Ox3. CN II-XII grossly intact. Moves all extremities spontaneously. Full sensation throughout. Normal gait. DTR 2+ throughout upper and lower extremities. Finger to nose intact. Psych:  Responds to questions appropriately with a normal affect.   Assessment/Plan:  68 y.o. y/o female here for CPE 1. Welcome to Medicare preventive visit   Assessment/Plan:  68 y.o. y/o female here for CPE  1. Hypertension  Blood pressure at goal at 122/80 today. Cont current medication and check labs monitor.  - BASIC METABOLIC PANEL WITH GFR  2. Vitamin D deficiency  History of vitamin D deficiency. 04/18/2008 vitamin D level was 17.8. She has been treated with supplementation since that time. Currently taking 1000 units daily.  Her vitamin D level is excellent she is to continue current dose of vitamin D supplementation  3. Preventive Care  A. Screening Labs: She came in had fasting labs 06/02/14 BMET Normal CBC normal Lipid panel excellent with HDL 66 and LDL 122 Vitamin D 61  B. Breast cancer screening  She reports that she had routine mammogram October 2013. At that mammogram she was told to followup in 6 months. However she did not follow up until September 2014. Says at that time she had an additional ultrasound as well  as 3-D mammogram. She states she was told that the prior spot they were looking at had not changed. However they saw a new spot on this one so they recommended repeat exam in 6 months again. However she says that they suspected that this areas of concern are most likely cysts.  She had a breast ultrasound 11/15/2013. She states that she was due for her followup 3-D mammogram around September 20. She states that she is aware and she will go home and schedule followup. States that she has this done at [facility].  C.  pelvic exam  Pelvic exam last done here 06/27/13--  normal. She had a Pap smear October 2013 which was normal. Therefore repeat is not due yet.   D. Pap:  Last Pap was October 2013 was normal.   E. Colorectal Cancer Screening:  She had colonoscopy 2009. Normal. Repeat 10 years. Due to repeat 2019.  He says this was done at [place].  F. Immunizations:  Influenza: She had one 05/29/2013.  She is agreeable  to get one for this season today 06/05/2014 Tetanus: Tdap given here 06/27/2013 Pneumococcal: Pneumovax 23 given here 04/17/2009. She is agreeable to receive Prevnar 13 today. Prevnar 13 given here 06/05/2014 Zostavax: She received this 06/10/2011.    Routine followup office visit in one year or sooner if needed.  Signed,    Signed, [address] [place], [place], [facility] 06/05/2014 8:33 AM

## 2014-06-05 NOTE — Addendum Note (Signed)
Addended by: Olena Mater on: 06/05/2014 09:52 AM   Modules accepted: Orders

## 2014-06-19 ENCOUNTER — Other Ambulatory Visit: Payer: Self-pay | Admitting: Family Medicine

## 2014-06-19 DIAGNOSIS — Z09 Encounter for follow-up examination after completed treatment for conditions other than malignant neoplasm: Secondary | ICD-10-CM | POA: Diagnosis not present

## 2014-06-19 DIAGNOSIS — N6002 Solitary cyst of left breast: Secondary | ICD-10-CM

## 2014-06-19 DIAGNOSIS — N63 Unspecified lump in breast: Secondary | ICD-10-CM | POA: Diagnosis not present

## 2014-06-20 ENCOUNTER — Telehealth: Payer: Self-pay | Admitting: *Deleted

## 2014-06-20 NOTE — Telephone Encounter (Signed)
Received fax from [facility] stating that pt has a mammogram on 11/15/13 with results abnormal,for which additional evaluation was recommended. Pt needs to call [facility] to arrange appt for Diagnostic Mammogram and [place]. LMTRC to pt.

## 2014-06-24 ENCOUNTER — Encounter: Payer: Self-pay | Admitting: *Deleted

## 2014-07-02 ENCOUNTER — Encounter: Payer: Self-pay | Admitting: Family Medicine

## 2014-07-16 NOTE — Telephone Encounter (Signed)
lmtrc

## 2014-07-17 ENCOUNTER — Encounter: Payer: Self-pay | Admitting: Physician Assistant

## 2014-07-17 NOTE — Telephone Encounter (Signed)
Pt had another mammogram in Oct 2015 at [facility], will retrieve records from [facility]

## 2014-09-24 ENCOUNTER — Ambulatory Visit (INDEPENDENT_AMBULATORY_CARE_PROVIDER_SITE_OTHER): Payer: Medicare Other | Admitting: Physician Assistant

## 2014-09-24 ENCOUNTER — Encounter: Payer: Self-pay | Admitting: Physician Assistant

## 2014-09-24 VITALS — BP 118/68 | HR 76 | Temp 98.5°F | Resp 18 | Wt 192.0 lb

## 2014-09-24 DIAGNOSIS — B9689 Other specified bacterial agents as the cause of diseases classified elsewhere: Principal | ICD-10-CM

## 2014-09-24 DIAGNOSIS — J988 Other specified respiratory disorders: Secondary | ICD-10-CM

## 2014-09-24 MED ORDER — HYDROCOD POLST-CHLORPHEN POLST 10-8 MG/5ML PO LQCR: 5.0000 mL | Freq: Two times a day (BID) | ORAL | Status: DC | PRN

## 2014-09-24 MED ORDER — AZITHROMYCIN 250 MG PO TABS: ORAL_TABLET | ORAL | Status: DC

## 2014-09-24 NOTE — Progress Notes (Signed)
    Patient ID: Paige Martinez MRN: [id number], DOB: 16-Oct-1945, 69 y.o. Date of Encounter: 09/24/2014, 12:42 PM    Chief Complaint:  Chief Complaint  Patient presents with  . sick         HPI: 69 y.o. year old white female says that she has been sick for several weeks. Says that she was having head and nasal congestion. Used multiple over-the-counter medications thinking that it would resolve. However never completely resolved. Last Wednesday, which was 7 days ago, developed sore throat and increased congestion. Since then the symptoms have continued to worsen. Continues to have head and nasal congestion and drainage down her throat. Has cough. Not sure whether this is secondary to postnasal nasal drip or whether she also has chest congestion. No significant sore throat now. No ear ache. No fevers or chills.     Home Meds:   Outpatient Prescriptions Prior to Visit  Medication Sig Dispense Refill  . cholecalciferol (VITAMIN D) 1000 UNITS tablet Take 1,000 Units by mouth daily.    Marland Kitchen lisinopril (PRINIVIL,ZESTRIL) 10 MG tablet TAKE ONE TABLET BY MOUTH EVERY DAY 90 tablet 3  . Multiple Vitamin (MULTIVITAMIN) tablet Take 1 tablet by mouth daily.     No facility-administered medications prior to visit.    Allergies: No Known Allergies    Review of Systems: See HPI for pertinent ROS. All other ROS negative.    Physical Exam: Blood pressure 118/68, pulse 76, temperature 98.5 F (36.9 C), temperature source Oral, resp. rate 18, weight 192 lb (87.091 kg)., Body mass index is 28.15 kg/(m^2). General:  Overweight white female . Appears in no acute distress. HEENT: Normocephalic, atraumatic, eyes without discharge, sclera non-icteric, nares are without discharge. Bilateral auditory canals clear, TM's are without perforation, pearly grey and translucent with reflective cone of light bilaterally. Oral cavity moist, posterior pharynx without exudate, erythema, peritonsillar abscess. No  tenderness with percussion of frontal or maxillary sinuses bilaterally.  Neck: Supple. No thyromegaly. No lymphadenopathy. Lungs: Clear bilaterally to auscultation without wheezes, rales, or rhonchi. Breathing is unlabored. Heart: Regular rhythm. No murmurs, rubs, or gallops. Msk:  Strength and tone normal for age. Extremities/Skin: Warm and dry. Neuro: Alert and oriented X 3. Moves all extremities spontaneously. Gait is normal. CNII-XII grossly in tact. Psych:  Responds to questions appropriately with a normal affect.     ASSESSMENT AND PLAN:  69 y.o. year old female with  1. Bacterial respiratory infection She requests prescription cough suppressant so that she can get some sleep. She is to take antibiotic as directed and complete all of it. Follow-up if symptoms do not resolve within 1 week after completion of antibiotic. - azithromycin (ZITHROMAX) 250 MG tablet; Day 1: Take 2 daily.  Days 2-5: Take 1 daily.  Dispense: 6 tablet; Refill: 0 - chlorpheniramine-HYDROcodone (TUSSIONEX) 10-8 MG/5ML LQCR; Take 5 mLs by mouth every 12 (twelve) hours as needed for cough.  Dispense: 115 mL; Refill: 0   Signed, [address] [place], [place], [facility] 09/24/2014 12:42 PM

## 2015-02-06 DIAGNOSIS — M71572 Other bursitis, not elsewhere classified, left ankle and foot: Secondary | ICD-10-CM | POA: Diagnosis not present

## 2015-02-06 DIAGNOSIS — M722 Plantar fascial fibromatosis: Secondary | ICD-10-CM | POA: Diagnosis not present

## 2015-02-06 DIAGNOSIS — M71571 Other bursitis, not elsewhere classified, right ankle and foot: Secondary | ICD-10-CM | POA: Diagnosis not present

## 2015-02-19 DIAGNOSIS — M722 Plantar fascial fibromatosis: Secondary | ICD-10-CM | POA: Diagnosis not present

## 2015-02-19 DIAGNOSIS — M71571 Other bursitis, not elsewhere classified, right ankle and foot: Secondary | ICD-10-CM | POA: Diagnosis not present

## 2015-02-19 DIAGNOSIS — M71572 Other bursitis, not elsewhere classified, left ankle and foot: Secondary | ICD-10-CM | POA: Diagnosis not present

## 2015-06-04 ENCOUNTER — Other Ambulatory Visit: Payer: Self-pay | Admitting: Physician Assistant

## 2015-06-04 ENCOUNTER — Encounter: Payer: Self-pay | Admitting: Family Medicine

## 2015-06-04 NOTE — Telephone Encounter (Signed)
Medication refill for one time only.  Patient needs to be seen.  Letter sent for patient to call and schedule 

## 2015-06-11 ENCOUNTER — Encounter: Payer: Self-pay | Admitting: Physician Assistant

## 2015-06-11 ENCOUNTER — Ambulatory Visit (INDEPENDENT_AMBULATORY_CARE_PROVIDER_SITE_OTHER): Payer: Medicare Other | Admitting: Physician Assistant

## 2015-06-11 VITALS — BP 112/66 | HR 60 | Temp 98.4°F | Resp 18 | Ht 68.5 in | Wt 189.0 lb

## 2015-06-11 DIAGNOSIS — Z23 Encounter for immunization: Secondary | ICD-10-CM

## 2015-06-11 DIAGNOSIS — M8580 Other specified disorders of bone density and structure, unspecified site: Secondary | ICD-10-CM

## 2015-06-11 DIAGNOSIS — Z0000 Encounter for general adult medical examination without abnormal findings: Secondary | ICD-10-CM | POA: Diagnosis not present

## 2015-06-11 DIAGNOSIS — E559 Vitamin D deficiency, unspecified: Secondary | ICD-10-CM | POA: Diagnosis not present

## 2015-06-11 DIAGNOSIS — I10 Essential (primary) hypertension: Secondary | ICD-10-CM | POA: Diagnosis not present

## 2015-06-11 LAB — CBC WITH DIFFERENTIAL/PLATELET
Basophils Absolute: 0 10*3/uL (ref 0.0–0.1)
Basophils Relative: 0 % (ref 0–1)
Eosinophils Absolute: 0.2 10*3/uL (ref 0.0–0.7)
Eosinophils Relative: 2 % (ref 0–5)
HCT: 42.5 % (ref 36.0–46.0)
Hemoglobin: 13.9 g/dL (ref 12.0–15.0)
LYMPHS PCT: 20 % (ref 12–46)
Lymphs Abs: 1.6 10*3/uL (ref 0.7–4.0)
MCH: 29 pg (ref 26.0–34.0)
MCHC: 32.7 g/dL (ref 30.0–36.0)
MCV: 88.5 fL (ref 78.0–100.0)
MONO ABS: 0.4 10*3/uL (ref 0.1–1.0)
MONOS PCT: 5 % (ref 3–12)
MPV: 10.9 fL (ref 8.6–12.4)
NEUTROS ABS: 5.7 10*3/uL (ref 1.7–7.7)
Neutrophils Relative %: 73 % (ref 43–77)
PLATELETS: 307 10*3/uL (ref 150–400)
RBC: 4.8 MIL/uL (ref 3.87–5.11)
RDW: 13.2 % (ref 11.5–15.5)
WBC: 7.8 10*3/uL (ref 4.0–10.5)

## 2015-06-11 LAB — COMPLETE METABOLIC PANEL WITH GFR
ALT: 12 U/L (ref 6–29)
AST: 18 U/L (ref 10–35)
Albumin: 3.9 g/dL (ref 3.6–5.1)
Alkaline Phosphatase: 108 U/L (ref 33–130)
BILIRUBIN TOTAL: 0.4 mg/dL (ref 0.2–1.2)
BUN: 13 mg/dL (ref 7–25)
CHLORIDE: 104 mmol/L (ref 98–110)
CO2: 25 mmol/L (ref 20–31)
Calcium: 9.1 mg/dL (ref 8.6–10.4)
Creat: 0.76 mg/dL (ref 0.50–0.99)
GFR, EST NON AFRICAN AMERICAN: 80 mL/min (ref >=60)
GLUCOSE: 90 mg/dL (ref 70–99)
POTASSIUM: 4.4 mmol/L (ref 3.5–5.3)
SODIUM: 140 mmol/L (ref 135–146)
Total Protein: 6.3 g/dL (ref 6.1–8.1)

## 2015-06-11 LAB — LIPID PANEL
CHOL/HDL RATIO: 2.7 ratio (ref <=5.0)
Cholesterol: 179 mg/dL (ref 125–200)
HDL: 67 mg/dL (ref >=46)
LDL CALC: 99 mg/dL (ref <130)
TRIGLYCERIDES: 66 mg/dL (ref <150)
VLDL: 13 mg/dL (ref <30)

## 2015-06-11 LAB — TSH: TSH: 2.177 u[IU]/mL (ref 0.350–4.500)
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
## 2015-06-12 LAB — VITAMIN D 25 HYDROXY (VIT D DEFICIENCY, FRACTURES): Vit D, 25-Hydroxy: 52 ng/mL (ref 30–100)

## 2015-06-15 ENCOUNTER — Encounter: Payer: Self-pay | Admitting: Family Medicine

## 2015-06-15 LAB — PAP, THIN PREP, IMAGING, MEDICARE

## 2015-07-14 ENCOUNTER — Encounter: Payer: Self-pay | Admitting: *Deleted

## 2015-07-30 DIAGNOSIS — Z780 Asymptomatic menopausal state: Secondary | ICD-10-CM | POA: Diagnosis not present

## 2015-07-30 DIAGNOSIS — M8589 Other specified disorders of bone density and structure, multiple sites: Secondary | ICD-10-CM | POA: Diagnosis not present

## 2015-07-30 DIAGNOSIS — R928 Other abnormal and inconclusive findings on diagnostic imaging of breast: Secondary | ICD-10-CM | POA: Diagnosis not present

## 2015-07-30 LAB — HM DEXA SCAN

## 2015-07-30 LAB — HM MAMMOGRAPHY

## 2015-07-31 ENCOUNTER — Encounter: Payer: Self-pay | Admitting: Family Medicine

## 2015-08-05 DIAGNOSIS — M8580 Other specified disorders of bone density and structure, unspecified site: Secondary | ICD-10-CM | POA: Insufficient documentation

## 2015-08-06 ENCOUNTER — Telehealth: Payer: Self-pay | Admitting: Family Medicine

## 2015-08-06 NOTE — Telephone Encounter (Signed)
Pt called with Dexa Scan results from [facility].  Scan shows Osteopenia.  Pt told to continue current Vitamin D and to add OTC Calcium supplements.

## 2015-08-12 ENCOUNTER — Encounter: Payer: Self-pay | Admitting: Family Medicine

## 2015-09-21 ENCOUNTER — Other Ambulatory Visit: Payer: Self-pay | Admitting: Physician Assistant

## 2015-09-21 NOTE — Telephone Encounter (Signed)
Script sent to [facility]

## 2016-04-03 ENCOUNTER — Other Ambulatory Visit: Payer: Self-pay | Admitting: Physician Assistant

## 2016-06-16 ENCOUNTER — Encounter: Payer: Self-pay | Admitting: Physician Assistant

## 2016-06-16 ENCOUNTER — Ambulatory Visit (INDEPENDENT_AMBULATORY_CARE_PROVIDER_SITE_OTHER): Payer: Medicare Other | Admitting: Physician Assistant

## 2016-06-16 VITALS — BP 110/72 | HR 54 | Temp 97.5°F | Resp 16 | Wt 195.0 lb

## 2016-06-16 DIAGNOSIS — Z0000 Encounter for general adult medical examination without abnormal findings: Secondary | ICD-10-CM

## 2016-06-16 DIAGNOSIS — M8580 Other specified disorders of bone density and structure, unspecified site: Secondary | ICD-10-CM | POA: Diagnosis not present

## 2016-06-16 DIAGNOSIS — I10 Essential (primary) hypertension: Secondary | ICD-10-CM | POA: Diagnosis not present

## 2016-06-16 DIAGNOSIS — E559 Vitamin D deficiency, unspecified: Secondary | ICD-10-CM | POA: Diagnosis not present

## 2016-06-16 DIAGNOSIS — Z23 Encounter for immunization: Secondary | ICD-10-CM

## 2016-06-16 LAB — CBC WITH DIFFERENTIAL/PLATELET
BASOS PCT: 0 %
Basophils Absolute: 0 cells/uL (ref 0–200)
Eosinophils Absolute: 249 cells/uL (ref 15–500)
Eosinophils Relative: 3 %
HEMATOCRIT: 41.9 % (ref 35.0–45.0)
HEMOGLOBIN: 13.8 g/dL (ref 12.0–15.0)
LYMPHS ABS: 1660 {cells}/uL (ref 850–3900)
Lymphocytes Relative: 20 %
MCH: 28.9 pg (ref 27.0–33.0)
MCHC: 32.9 g/dL (ref 32.0–36.0)
MCV: 87.7 fL (ref 80.0–100.0)
MONO ABS: 415 {cells}/uL (ref 200–950)
MPV: 10.8 fL (ref 7.5–12.5)
Monocytes Relative: 5 %
NEUTROS ABS: 5976 {cells}/uL (ref 1500–7800)
NEUTROS PCT: 72 %
Platelets: 329 10*3/uL (ref 140–400)
RBC: 4.78 MIL/uL (ref 3.80–5.10)
RDW: 14 % (ref 11.0–15.0)
WBC: 8.3 10*3/uL (ref 3.8–10.8)

## 2016-06-16 LAB — COMPLETE METABOLIC PANEL WITH GFR
ALBUMIN: 4 g/dL (ref 3.6–5.1)
ALK PHOS: 114 U/L (ref 33–130)
ALT: 13 U/L (ref 6–29)
AST: 18 U/L (ref 10–35)
BILIRUBIN TOTAL: 0.5 mg/dL (ref 0.2–1.2)
BUN: 12 mg/dL (ref 7–25)
CALCIUM: 9.3 mg/dL (ref 8.6–10.4)
CO2: 27 mmol/L (ref 20–31)
CREATININE: 0.7 mg/dL (ref 0.60–0.93)
Chloride: 104 mmol/L (ref 98–110)
GFR, Est African American: >89 mL/min (ref >=60)
GFR, Est Non African American: 88 mL/min (ref >=60)
GLUCOSE: 89 mg/dL (ref 70–99)
Potassium: 4.4 mmol/L (ref 3.5–5.3)
Sodium: 140 mmol/L (ref 135–146)
TOTAL PROTEIN: 6.4 g/dL (ref 6.1–8.1)

## 2016-06-16 LAB — LIPID PANEL
CHOLESTEROL: 201 mg/dL — AB (ref 125–200)
HDL: 64 mg/dL (ref >=46)
LDL Cholesterol: 118 mg/dL (ref <130)
Total CHOL/HDL Ratio: 3.1 Ratio (ref <=5.0)
Triglycerides: 93 mg/dL (ref <150)
VLDL: 19 mg/dL (ref <30)

## 2016-06-16 LAB — TSH: TSH: 4.97 m[IU]/L — AB

## 2016-06-16 NOTE — Progress Notes (Signed)
Subjective:   Patient presents for Complete Physical/ Medicare subsequent visit.  Review Past Medical/Family/Social: These are all documented in Epic and reviewed today.  Risk Factors  Current exercise habits:  She has not been doing any exercise recently. Knows she needs to start back using stationary bike. Dietary issues discussed: Discussed lower fat, lower carb diet  Cardiac risk factors: Obesity, Age, HTN  Depression Screen  (Note: if answer to either of the following is "Yes", a more complete depression screening is indicated)  Over the past two weeks, have you felt down, depressed or hopeless? No Over the past two weeks, have you felt little interest or pleasure in doing things? No Have you lost interest or pleasure in daily life? No Do you often feel hopeless? No Do you cry easily over simple problems? No   Activities of Daily Living  In your present state of health, do you have any difficulty performing the following activities?:  Driving? No  Managing money? No  Feeding yourself? No  Getting from bed to chair? No  Climbing a flight of stairs? No  Preparing food and eating?: No  Bathing or showering? No  Getting dressed: No  Getting to the toilet? No  Using the toilet:No  Moving around from place to place: No  In the past year have you fallen or had a near fall?:No  Are you sexually active? No  Do you have more than one partner? No   Hearing Difficulties: No  Do you often ask people to speak up or repeat themselves? No  Do you experience ringing or noises in your ears? No Do you have difficulty understanding soft or whispered voices? No  Do you feel that you have a problem with memory? No Do you often misplace items? No  Do you feel safe at home? Yes  Cognitive Testing  Alert? Yes Normal Appearance?Yes  Oriented to person? Yes Place? Yes  Time? Yes  Recall of three objects? Yes  Can perform simple calculations? Yes  Displays appropriate judgment?Yes  Can  read the correct time from a watch face?Yes   List the Names of Other Physician/Practitioners you currently use:  None  Indicate any recent Medical Services you may have received from other than [facility] providers in the past year (date may be approximate).   Screening Tests / Date Colonoscopy---[facility]---2009--normal--repeat 10 years                     Zostavax ---06/10/2011 Mammogram ---Has at [facility]. Says that she has been having this every year. Influenza Vaccine --Pt agreeable to receive today Tetanus/tdap---06/27/2013    Assessment:    Annual wellness medicare exam   Plan:    During the course of the visit the patient was educated and counseled about appropriate screening and preventive services including:  Screening mammography  Colorectal cancer screening  Shingles vaccine. Prescription given to that she can get the vaccine at the pharmacy or Medicare part D.  Screen + for depression. PHQ- 9 score of 12 (moderate depression). We discussed the options of counseling versus possibly a medication. I encouraged her strongly think about the counseling. She is going through some medical problems currently and her husband is as well Mrs. been very stressful for her. She says she will think about it. She does have Xanax to use as needed. Though she may benefit from an SSRI for her more depressive type symptoms but she wants to hold off at this time.  I aksed her  to please have her cardioloist send records since we have none on file.  Diet review for nutrition referral? Yes ____ Not Indicated __x__  Patient Instructions (the written plan) was given to the patient.  Medicare Attestation  I have personally reviewed:  The patient's medical and social history  Their use of alcohol, tobacco or illicit drugs  Their current medications and supplements  The patient's functional ability including ADLs,fall risks, home safety risks, cognitive, and hearing and visual impairment  Diet and physical  activities  Evidence for depression or mood disorders  The patient's weight, height, BMI, and visual acuity have been recorded in the chart. I have made referrals, counseling, and provided education to the patient based on review of the above and I have provided the patient with a written personalized care plan for preventive services.    HPI: She retired this past August.  She is taking blood pressure medication as directed. No hacky cough. No lightheadedness. No other adverse effects. She is taking calcium and Vit D as directed for her osteopenia  Review of Systems: Consitutional: No fever, chills, fatigue, night sweats, lymphadenopathy. No significant/unexplained weight changes. Eyes: No visual changes, eye redness, or discharge. ENT/Mouth: No ear pain, sore throat, nasal drainage, or sinus pain. Cardiovascular: No chest pressure,heaviness, tightness or squeezing, even with exertion. No increased shortness of breath or dyspnea on exertion.No palpitations, edema, orthopnea, PND. Respiratory: No cough, hemoptysis, SOB, or wheezing. Gastrointestinal: No anorexia, dysphagia, reflux, pain, nausea, vomiting, hematemesis, diarrhea, constipation, BRBPR, or melena. Breast: No mass, nodules, bulging, or retraction. No skin changes or inflammation. No nipple discharge. No lymphadenopathy. Genitourinary: No dysuria, hematuria, incontinence, vaginal discharge, pruritis, burning, abnormal bleeding, or pain. Musculoskeletal: No decreased ROM, No joint pain or swelling. No significant pain in neck, back, or extremities. Skin: No rash, pruritis, or concerning lesions. Neurological: No headache, dizziness, syncope, seizures, tremors, memory loss, coordination problems, or paresthesias. Psychological: No anxiety, depression, hallucinations, SI/HI. Endocrine: No polydipsia, polyphagia, polyuria, or known diabetes.No increased fatigue. No palpitations/rapid heart rate. No significant/unexplained weight  change. All other systems were reviewed and are otherwise negative.  Past Medical History:  Diagnosis Date  . Hypertension   . 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 Vitamin D deficiency 04/18/2008     Past Surgical History:  Procedure Laterality Date  . TUBAL LIGATION      Home Meds:  Outpatient Medications Prior to Visit  Medication Sig Dispense Refill  . Calcium Citrate-Vitamin D (CALCIUM + D PO) Take by mouth. Pt told to take 1200 - 1500 mg daily in divided doses    . cholecalciferol (VITAMIN D) 1000 UNITS tablet Take 1,000 Units by mouth daily.    Marland Kitchen lisinopril (PRINIVIL,ZESTRIL) 10 MG tablet TAKE ONE TABLET BY MOUTH ONCE DAILY (  NEEDS  TO  BE  SEEN  BEFORE  REFILLS) 90 tablet 0  . Multiple Vitamin (MULTIVITAMIN) tablet Take 1 tablet by mouth daily.     No facility-administered medications prior to visit.     Allergies: No Known Allergies    Family History  Problem Relation Age of Onset  . Alcohol abuse Father   . Cancer Father 18    Pancreatic Cancer  . Hypertension Sister   . Hypertension Brother     Physical Exam: Blood pressure 110/72, pulse (!) 54, temperature 97.5 F (36.4 C), resp. rate 16, weight 195 lb (88.5 kg), SpO2 97 %., Body mass index is 29.22 kg/m. General: WNWD WF. Appears in no acute distress. HEENT: Normocephalic, atraumatic. Conjunctiva  pink, sclera non-icteric. Pupils 2 mm constricting to 1 mm, round, regular, and equally reactive to light and accomodation. EOMI.  Right ear canal was obstructed with cerumen. This was irrigated during today's visit. Left ear canal patent. Left TM with good cone of light and without pathology. Nasal mucosa pink. Nares are without discharge. No sinus tenderness. Oral mucosa pink.  Pharynx without exudate.   Neck: Supple. Trachea midline. No thyromegaly. Full ROM. No lymphadenopathy.No Carotid Bruits. Lungs: Clear to auscultation bilaterally without wheezes, rales, or rhonchi. Breathing is of normal effort and  unlabored. Cardiovascular: RRR with S1 S2. No murmurs, rubs, or gallops. Distal pulses 2+ symmetrically. No carotid or abdominal bruits. Breast: Breast exam is normal bilaterally. No masses. No nipple discharge. No skin changes. Pelvic Exam: Deferred today Abdomen: Soft, non-tender, non-distended with normoactive bowel sounds. No hepatosplenomegaly or masses. No rebound/guarding. No CVA tenderness. No hernias.  Musculoskeletal: Full range of motion and 5/5 strength throughout.  Skin: Warm and moist without erythema, ecchymosis, wounds, or rash. Neuro: A+Ox3. CN II-XII grossly intact. Moves all extremities spontaneously. Full sensation throughout. Normal gait. DTR 2+ throughout upper and lower extremities.  Psych:  Responds to questions appropriately with a normal affect.   Assessment/Plan:  70 y.o. y/o female here for CPE  1. Medicare annual wellness visit, subsequent - CBC with Differential/Platelet - COMPLETE METABOLIC PANEL WITH GFR - Lipid panel - TSH - Vit D  25 hydroxy (rtn osteoporosis monitoring)  2. Visit for preventive health examination  A. Screening Labs: - CBC with Differential/Platelet - COMPLETE METABOLIC PANEL WITH GFR - Lipid panel - TSH - Vit D  25 hydroxy (rtn osteoporosis monitoring)  B. Breast cancer screening  She reports that she's been having her mammogram each year. Has it at [facility].   C. DEXA DEXA scan performed 07/30/15. Consistent with osteopenia. T score is -1.30 and -0.40. Told patient to continue current dose of vitamin D which is 1000 units daily and last vitamin D level was 06/11/15 at 52. Add over-the-counter calcium approximate 1000 mg daily.  D.  Pap/Smear/pelvic exam : She had Pap smear here 05/2015.  Defers Pelvic exam 05/2016  E. Colorectal Cancer Screening:  She had colonoscopy 2009. Normal. Repeat 10 years. Due to repeat 2019.  She says this was done at [place].  F. Immunizations:  Influenza: She had one 05/29/2013, 06/05/2014,  06/11/2015, 06/16/2016 Tetanus: Tdap given here 06/27/2013 Pneumococcal: Pneumovax 23 given here 04/17/2009.  Prevnar 13 given here 06/05/2014 Zostavax: She received this 06/10/2011.    3. Essential hypertension Blood pressure at goal  today. Cont current medication and check labs monitor.  - BASIC METABOLIC PANEL WITH GFR    4. Vitamin D deficiency She has h/o Vit D Deficiency.  Also DEXA shows Osteopenia.  Will recheck Vit D level.      Routine followup office visit in one year or sooner if needed.  Signed,    Signed, [address] [place], [place], [facility] 06/16/2016 9:04 AM

## 2016-06-16 NOTE — Addendum Note (Signed)
Addended by: Vonna Kotyk A on: 06/16/2016 09:43 AM   Modules accepted: Orders

## 2016-06-17 LAB — VITAMIN D 25 HYDROXY (VIT D DEFICIENCY, FRACTURES): Vit D, 25-Hydroxy: 53 ng/mL (ref 30–100)

## 2016-06-21 ENCOUNTER — Other Ambulatory Visit: Payer: Self-pay | Admitting: Family Medicine

## 2016-06-27 ENCOUNTER — Telehealth: Payer: Self-pay | Admitting: Physician Assistant

## 2016-06-27 MED ORDER — LISINOPRIL 10 MG PO TABS: ORAL_TABLET | ORAL | 3 refills | Status: DC

## 2016-06-27 NOTE — Telephone Encounter (Signed)
Patient left vm stating [facility] hasn't gotten lisinopril refill.  CB# [phone]

## 2016-06-27 NOTE — Telephone Encounter (Signed)
Resent Rx for lisinopril

## 2016-08-04 DIAGNOSIS — Z1231 Encounter for screening mammogram for malignant neoplasm of breast: Secondary | ICD-10-CM | POA: Diagnosis not present

## 2016-08-04 LAB — HM MAMMOGRAPHY

## 2016-08-18 ENCOUNTER — Encounter: Payer: Self-pay | Admitting: Family Medicine

## 2017-06-21 ENCOUNTER — Ambulatory Visit (INDEPENDENT_AMBULATORY_CARE_PROVIDER_SITE_OTHER): Payer: Medicare Other | Admitting: Physician Assistant

## 2017-06-21 ENCOUNTER — Encounter: Payer: Self-pay | Admitting: Physician Assistant

## 2017-06-21 VITALS — BP 140/82 | HR 57 | Temp 97.6°F | Resp 16 | Ht 69.0 in | Wt 208.4 lb

## 2017-06-21 DIAGNOSIS — Z1159 Encounter for screening for other viral diseases: Secondary | ICD-10-CM | POA: Diagnosis not present

## 2017-06-21 DIAGNOSIS — Z0000 Encounter for general adult medical examination without abnormal findings: Secondary | ICD-10-CM | POA: Diagnosis not present

## 2017-06-21 DIAGNOSIS — E785 Hyperlipidemia, unspecified: Secondary | ICD-10-CM | POA: Diagnosis not present

## 2017-06-21 DIAGNOSIS — Z23 Encounter for immunization: Secondary | ICD-10-CM

## 2017-06-21 DIAGNOSIS — Z79899 Other long term (current) drug therapy: Secondary | ICD-10-CM | POA: Diagnosis not present

## 2017-06-21 DIAGNOSIS — I10 Essential (primary) hypertension: Secondary | ICD-10-CM | POA: Diagnosis not present

## 2017-06-21 NOTE — Addendum Note (Signed)
Addended by: Vonna Kotyk A on: 06/21/2017 05:17 PM   Modules accepted: Orders

## 2017-06-21 NOTE — Progress Notes (Addendum)
Subjective:   Patient presents for Complete Physical/ Medicare subsequent visit.  Review Past Medical/Family/Social: These are all documented in Epic and reviewed today.  Risk Factors  Current exercise habits:  She has not been doing any exercise recently. She just quit work about a year ago. I asked what she does to keep herself busy. She says that a lot of days she doesn't even get out of her pajamas. Other days her granddaughter calls and wants to do things with her but some days she just wants to stay in her pajamas and not have to get dressed and go do anything. Says that she has been going to some festivals recently. Dietary issues discussed: Discussed lower fat, lower carb diet  Cardiac risk factors: Obesity, Age, HTN  Depression Screen  (Note: if answer to either of the following is "Yes", a more complete depression screening is indicated)  Over the past two weeks, have you felt down, depressed or hopeless? No Over the past two weeks, have you felt little interest or pleasure in doing things? No Have you lost interest or pleasure in daily life? No Do you often feel hopeless? No Do you cry easily over simple problems? No   Activities of Daily Living  In your present state of health, do you have any difficulty performing the following activities?:  Driving? No  Managing money? No  Feeding yourself? No  Getting from bed to chair? No  Climbing a flight of stairs? No  Preparing food and eating?: No  Bathing or showering? No  Getting dressed: No  Getting to the toilet? No  Using the toilet:No  Moving around from place to place: No  In the past year have you fallen or had a near fall?:No  Are you sexually active? No  Do you have more than one partner? No   Hearing Difficulties: No  Do you often ask people to speak up or repeat themselves? Yes ---Discussed this today. She states that this does not affect her. She has no problems hearing normal conversation. She is having no  difficulty hearing me during normal conversation during visit today. Do you experience ringing or noises in your ears? No Do you have difficulty understanding soft or whispered voices? [place] ---Discussed this today. She states that this does not affect her. She has no problems hearing normal conversation. She is having no difficulty hearing me during normal conversation during visit today. Do you feel that you have a problem with memory? No Do you often misplace items? No  Do you feel safe at home? Yes  Cognitive Testing  Alert? Yes Normal Appearance?Yes  Oriented to person? Yes Place? Yes  Time? Yes  Recall of three objects? Yes  Can perform simple calculations? Yes  Displays appropriate judgment?Yes  Can read the correct time from a watch face?Yes   List the Names of Other Physician/Practitioners you currently use:  None  Indicate any recent Medical Services you may have received from other than [facility] providers in the past year (date may be approximate).   Screening Tests / Date Colonoscopy---[facility]---2009--normal--repeat 10 years                     Zostavax ---06/10/2011 Mammogram ---Has at [facility]. Says that she has been having this every year. Influenza Vaccine --Pt agreeable to receive today Tetanus/tdap---06/27/2013    Assessment:    Annual wellness medicare exam   Plan:    During the course of the visit the patient was educated  and counseled about appropriate screening and preventive services including:  Screening mammography  Colorectal cancer screening  Shingles vaccine. Prescription given to that she can get the vaccine at the pharmacy or Medicare part D.  Screen + for depression. PHQ- 9 score of 12 (moderate depression). We discussed the options of counseling versus possibly a medication. I encouraged her strongly think about the counseling. She is going through some medical problems currently and her husband is as well Mrs. been very stressful for her. She says  she will think about it. She does have Xanax to use as needed. Though she may benefit from an SSRI for her more depressive type symptoms but she wants to hold off at this time.  I aksed her to please have her cardioloist send records since we have none on file.  Diet review for nutrition referral? Yes ____ Not Indicated __x__  Patient Instructions (the written plan) was given to the patient.  Medicare Attestation  I have personally reviewed:  The patient's medical and social history  Their use of alcohol, tobacco or illicit drugs  Their current medications and supplements  The patient's functional ability including ADLs,fall risks, home safety risks, cognitive, and hearing and visual impairment  Diet and physical activities  Evidence for depression or mood disorders  The patient's weight, height, BMI, and visual acuity have been recorded in the chart. I have made referrals, counseling, and provided education to the patient based on review of the above and I have provided the patient with a written personalized care plan for preventive services.    HPI: She retired August 2017. She is taking blood pressure medication as directed. No hacky cough. No lightheadedness. No other adverse effects. She is taking calcium and Vit D as directed for her osteopenia  Review of Systems: Consitutional: No fever, chills, fatigue, night sweats, lymphadenopathy. No significant/unexplained weight changes. Eyes: No visual changes, eye redness, or discharge. ENT/Mouth: No ear pain, sore throat, nasal drainage, or sinus pain. Cardiovascular: No chest pressure,heaviness, tightness or squeezing, even with exertion. No increased shortness of breath or dyspnea on exertion.No palpitations, edema, orthopnea, PND. Respiratory: No cough, hemoptysis, SOB, or wheezing. Gastrointestinal: No anorexia, dysphagia, reflux, pain, nausea, vomiting, hematemesis, diarrhea, constipation, BRBPR, or melena. Breast: No mass, nodules,  bulging, or retraction. No skin changes or inflammation. No nipple discharge. No lymphadenopathy. Genitourinary: No dysuria, hematuria, incontinence, vaginal discharge, pruritis, burning, abnormal bleeding, or pain. Musculoskeletal: No decreased ROM, No joint pain or swelling. No significant pain in neck, back, or extremities. Skin: No rash, pruritis, or concerning lesions. Neurological: No headache, dizziness, syncope, seizures, tremors, memory loss, coordination problems, or paresthesias. Psychological: No anxiety, depression, hallucinations, SI/HI. Endocrine: No polydipsia, polyphagia, polyuria, or known diabetes.No increased fatigue. No palpitations/rapid heart rate. No significant/unexplained weight change. All other systems were reviewed and are otherwise negative.  Past Medical History:  Diagnosis Date  . Hypertension   . 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 Vitamin D deficiency 04/18/2008     Past Surgical History:  Procedure Laterality Date  . TUBAL LIGATION      Home Meds:  Outpatient Medications Prior to Visit  Medication Sig Dispense Refill  . Calcium Citrate-Vitamin D (CALCIUM + D PO) Take by mouth. Pt told to take 1200 - 1500 mg daily in divided doses    . cholecalciferol (VITAMIN D) 1000 UNITS tablet Take 1,000 Units by mouth daily.    Marland Kitchen lisinopril (PRINIVIL,ZESTRIL) 10 MG tablet TAKE ONE TABLET BY MOUTH ONCE DAILY 90 tablet 3  .  Multiple Vitamin (MULTIVITAMIN) tablet Take 1 tablet by mouth daily.     No facility-administered medications prior to visit.     Allergies: No Known Allergies    Family History  Problem Relation Age of Onset  . Alcohol abuse Father   . Cancer Father 99       Pancreatic Cancer  . Hypertension Sister   . Hypertension Brother     Physical Exam: Blood pressure 140/82, pulse (!) 57, temperature 97.6 F (36.4 C), temperature source Oral, resp. rate 16, height 5\' 9"  (1.753 m), weight 94.5 kg (208 lb 6.4 oz), SpO2 97 %., Body mass index is 30.78  kg/m. General: Obese WF. Appears in no acute distress. HEENT: Normocephalic, atraumatic. Conjunctiva pink, sclera non-icteric. Pupils 2 mm constricting to 1 mm, round, regular, and equally reactive to light and accomodation. EOMI. Bi;ateral ear canals appear normal. Bilateral TMs appear normal with good cone of light and without pathology. Nasal mucosa pink. Nares are without discharge. No sinus tenderness. Oral mucosa pink.  Pharynx without exudate.  She reports that she just had visit with the dentist yesterday.  Neck: Supple. Trachea midline. No thyromegaly. Full ROM. No lymphadenopathy.No Carotid Bruits. Lungs: Clear to auscultation bilaterally without wheezes, rales, or rhonchi. Breathing is of normal effort and unlabored. Cardiovascular: RRR with S1 S2. No murmurs, rubs, or gallops. Distal pulses 2+ symmetrically. No carotid or abdominal bruits. Breast: Breast exam is normal bilaterally. No masses. No nipple discharge. No skin changes. Pelvic Exam: Deferred today Abdomen: Soft, non-tender, non-distended with normoactive bowel sounds. No hepatosplenomegaly or masses. No rebound/guarding. No CVA tenderness. No hernias.  Musculoskeletal: Full range of motion and 5/5 strength throughout.  Skin: Warm and moist without erythema, ecchymosis, wounds, or rash. Neuro: A+Ox3. CN II-XII grossly intact. Moves all extremities spontaneously. Full sensation throughout. Normal gait.  Psych:  Responds to questions appropriately with a normal affect.   Assessment/Plan:  71 y.o. y/o female here for CPE  1. Medicare annual wellness visit, subsequent - CBC with Differential/Platelet - COMPLETE METABOLIC PANEL WITH GFR - Lipid panel - TSH Vitamin D level was good 05/2016 with vitamin D 53. Will not recheck this lab today.  2. Visit for preventive health examination  A. Screening Labs: - CBC with Differential/Platelet - COMPLETE METABOLIC PANEL WITH GFR - Lipid panel - TSH -Vitamin D level was good  05/2016 with vitamin D 53. Will not recheck this lab today  B. Breast cancer screening  She reports that she's been having her mammogram each year. Has it at [facility].   C. DEXA DEXA scan performed 07/30/15. Consistent with osteopenia. T score is -1.30 and -0.40. Told patient to continue current dose of vitamin D which is 1000 units daily and last vitamin D level was 06/11/15 at 52. Add over-the-counter calcium approximate 1000 mg daily. ADDENDUM ADDED 08/28/2017: DEXA Scan performed 08/15/2017----T score -1.30.  Osteopenia.  Will have her continue calcium, vitamin D, weightbearing exercise.  D.  Pap/Smear/pelvic exam : She had Pap smear here 05/2015.  Defers Pelvic exam 05/2016  E. Colorectal Cancer Screening:  She had colonoscopy 2009. Normal. Repeat 10 years. Due to repeat 2019.  She says this was done at [place].  F. Immunizations:  Influenza: She had one 05/29/2013, 06/05/2014, 06/11/2015, 06/16/2016, 06/21/2017 Tetanus: Tdap given here 06/27/2013 Pneumococcal: Pneumovax 23 given here 04/17/2009.  Prevnar 13 given here 06/05/2014 Zostavax: She received this 06/10/2011.    3. Essential hypertension Blood pressure at goal  today. Cont current medication and check labs  monitor.  - BASIC METABOLIC PANEL WITH GFR    4. Vitamin D deficiency She has h/o Vit D Deficiency.  Also DEXA shows Osteopenia.  07/2016 vitamin D level was good at 53. Will wait to recheck this lab. Continue current dose vitamin D.  Need for hepatitis C screening test - Hepatitis C antibody   Routine followup office visit in one year or sooner if needed.  Signed,    Signed, [address] [place], [place], [facility] 06/21/2017 8:31 AM

## 2017-06-22 LAB — CBC WITH DIFFERENTIAL/PLATELET
Basophils Absolute: 48 cells/uL (ref 0–200)
Basophils Relative: 0.6 %
EOS ABS: 208 {cells}/uL (ref 15–500)
Eosinophils Relative: 2.6 %
HEMATOCRIT: 42.7 % (ref 35.0–45.0)
Hemoglobin: 14 g/dL (ref 11.7–15.5)
Lymphs Abs: 1608 cells/uL (ref 850–3900)
MCH: 28.5 pg (ref 27.0–33.0)
MCHC: 32.8 g/dL (ref 32.0–36.0)
MCV: 87 fL (ref 80.0–100.0)
MONOS PCT: 6.4 %
MPV: 11.2 fL (ref 7.5–12.5)
NEUTROS PCT: 70.3 %
Neutro Abs: 5624 cells/uL (ref 1500–7800)
Platelets: 302 10*3/uL (ref 140–400)
RBC: 4.91 10*6/uL (ref 3.80–5.10)
RDW: 13.1 % (ref 11.0–15.0)
TOTAL LYMPHOCYTE: 20.1 %
WBC: 8 10*3/uL (ref 3.8–10.8)
WBCMIX: 512 {cells}/uL (ref 200–950)

## 2017-06-22 LAB — COMPLETE METABOLIC PANEL WITH GFR
AG RATIO: 1.6 (calc) (ref 1.0–2.5)
ALT: 16 U/L (ref 6–29)
AST: 16 U/L (ref 10–35)
Albumin: 3.9 g/dL (ref 3.6–5.1)
Alkaline phosphatase (APISO): 113 U/L (ref 33–130)
BUN: 19 mg/dL (ref 7–25)
CALCIUM: 9.1 mg/dL (ref 8.6–10.4)
CO2: 24 mmol/L (ref 20–32)
Chloride: 105 mmol/L (ref 98–110)
Creat: 0.76 mg/dL (ref 0.60–0.93)
GFR, EST AFRICAN AMERICAN: 91 mL/min/{1.73_m2} (ref >=60)
GFR, EST NON AFRICAN AMERICAN: 79 mL/min/{1.73_m2} (ref >=60)
GLOBULIN: 2.4 g/dL (ref 1.9–3.7)
Glucose, Bld: 99 mg/dL (ref 65–99)
POTASSIUM: 4.8 mmol/L (ref 3.5–5.3)
SODIUM: 141 mmol/L (ref 135–146)
TOTAL PROTEIN: 6.3 g/dL (ref 6.1–8.1)
Total Bilirubin: 0.4 mg/dL (ref 0.2–1.2)

## 2017-06-22 LAB — LIPID PANEL
CHOL/HDL RATIO: 3.1 (calc) (ref <5.0)
Cholesterol: 183 mg/dL (ref <200)
HDL: 60 mg/dL (ref >50)
LDL Cholesterol (Calc): 104 mg/dL (calc) — ABNORMAL HIGH
Non-HDL Cholesterol (Calc): 123 mg/dL (calc) (ref <130)
Triglycerides: 98 mg/dL (ref <150)

## 2017-06-22 LAB — HEPATITIS C ANTIBODY
HEP C AB: NONREACTIVE
SIGNAL TO CUT-OFF: 0.01 (ref <1.00)

## 2017-06-22 LAB — TSH: TSH: 3.44 m[IU]/L (ref 0.40–4.50)

## 2017-08-15 ENCOUNTER — Encounter: Payer: Self-pay | Admitting: Physician Assistant

## 2017-08-15 DIAGNOSIS — Z1231 Encounter for screening mammogram for malignant neoplasm of breast: Secondary | ICD-10-CM | POA: Diagnosis not present

## 2017-08-15 DIAGNOSIS — M8589 Other specified disorders of bone density and structure, multiple sites: Secondary | ICD-10-CM | POA: Diagnosis not present

## 2017-08-15 LAB — HM MAMMOGRAPHY

## 2017-08-30 ENCOUNTER — Other Ambulatory Visit: Payer: Self-pay | Admitting: Physician Assistant

## 2017-09-27 ENCOUNTER — Telehealth: Payer: Self-pay

## 2017-09-27 NOTE — Telephone Encounter (Signed)
Call placed to patient to inform her of her bone density results which shows osteopenia as well as confirm she is still taking her calcium and vitamin D.LVMTRC

## 2017-10-05 NOTE — Telephone Encounter (Signed)
Patient is aware of bone density test results,Patient was also informed to continue to take her calcium and vitamin D

## 2018-06-22 DIAGNOSIS — Z23 Encounter for immunization: Secondary | ICD-10-CM | POA: Diagnosis not present

## 2018-06-27 ENCOUNTER — Encounter: Payer: Medicare Other | Admitting: Physician Assistant

## 2018-07-12 ENCOUNTER — Ambulatory Visit (INDEPENDENT_AMBULATORY_CARE_PROVIDER_SITE_OTHER): Payer: Medicare Other | Admitting: Family Medicine

## 2018-07-12 ENCOUNTER — Other Ambulatory Visit: Payer: Self-pay

## 2018-07-12 ENCOUNTER — Encounter: Payer: Self-pay | Admitting: Family Medicine

## 2018-07-12 VITALS — BP 132/64 | HR 82 | Temp 97.8°F | Resp 16 | Ht 69.0 in | Wt 202.0 lb

## 2018-07-12 DIAGNOSIS — E663 Overweight: Secondary | ICD-10-CM

## 2018-07-12 DIAGNOSIS — R946 Abnormal results of thyroid function studies: Secondary | ICD-10-CM

## 2018-07-12 DIAGNOSIS — Z1239 Encounter for other screening for malignant neoplasm of breast: Secondary | ICD-10-CM | POA: Diagnosis not present

## 2018-07-12 DIAGNOSIS — M8580 Other specified disorders of bone density and structure, unspecified site: Secondary | ICD-10-CM | POA: Diagnosis not present

## 2018-07-12 DIAGNOSIS — Z0000 Encounter for general adult medical examination without abnormal findings: Secondary | ICD-10-CM | POA: Diagnosis not present

## 2018-07-12 DIAGNOSIS — E559 Vitamin D deficiency, unspecified: Secondary | ICD-10-CM | POA: Diagnosis not present

## 2018-07-12 DIAGNOSIS — I10 Essential (primary) hypertension: Secondary | ICD-10-CM

## 2018-07-12 MED ORDER — ZOSTER VAC RECOMB ADJUVANTED 50 MCG/0.5ML IM SUSR: 0.5000 mL | Freq: Once | INTRAMUSCULAR | 1 refills | Status: AC

## 2018-07-12 NOTE — Patient Instructions (Addendum)
Paige Martinez , Thank you for taking time to come for your Medicare Wellness Visit. I appreciate your ongoing commitment to your health goals. Please review the following plan we discussed and let me know if I can assist you in the future.   These are the goals we discussed:  Good diet and exercise   Call insurance for cologuard screening price Mammogram I'm trying to order for next year   This is a list of the screening recommended for you and due dates:  Health Maintenance  Topic Date Due  . Colon Cancer Screening  12/04/1995  . Pneumonia vaccines (2 of 2 - PPSV23) 06/06/2015  . Mammogram  08/16/2019  . Tetanus Vaccine  06/28/2023  . Flu Shot  Completed  . DEXA scan (bone density measurement)  Completed  .  Hepatitis C: One time screening is recommended by Center for Disease Control  (CDC) for  adults born from [birth year] through 1965.   Completed    Colonoscopy, Adult A colonoscopy is an exam to look at the entire large intestine. During the exam, a lubricated, bendable tube is inserted into the anus and then passed into the rectum, colon, and other parts of the large intestine. A colonoscopy is often done as a part of normal colorectal screening or in response to certain symptoms, such as anemia, persistent diarrhea, abdominal pain, and blood in the stool. The exam can help screen for and diagnose medical problems, including:  Tumors.  Polyps.  Inflammation.  Areas of bleeding.  Tell a health care provider about:  Any allergies you have.  All medicines you are taking, including vitamins, herbs, eye drops, creams, and over-the-counter medicines.  Any problems you or family members have had with anesthetic medicines.  Any blood disorders you have.  Any surgeries you have had.  Any medical conditions you have.  Any problems you have had passing stool. What are the risks? Generally, this is a safe procedure. However, problems may occur, including:  Bleeding.  A tear in  the intestine.  A reaction to medicines given during the exam.  Infection (rare).  What happens before the procedure? Eating and drinking restrictions Follow instructions from your health care provider about eating and drinking, which may include:  A few days before the procedure - follow a low-fiber diet. Avoid nuts, seeds, dried fruit, raw fruits, and vegetables.  1-3 days before the procedure - follow a clear liquid diet. Drink only clear liquids, such as clear broth or bouillon, black coffee or tea, clear juice, clear soft drinks or sports drinks, gelatin dessert, and popsicles. Avoid any liquids that contain red or purple dye.  On the day of the procedure - do not eat or drink anything during the 2 hours before the procedure, or within the time period that your health care provider recommends.  Bowel prep If you were prescribed an oral bowel prep to clean out your colon:  Take it as told by your health care provider. Starting the day before your procedure, you will need to drink a large amount of medicated liquid. The liquid will cause you to have multiple loose stools until your stool is almost clear or light green.  If your skin or anus gets irritated from diarrhea, you may use these to relieve the irritation: ? Medicated wipes, such as adult wet wipes with aloe and vitamin E. ? A skin soothing-product like petroleum jelly.  If you vomit while drinking the bowel prep, take a break for up to 60  minutes and then begin the bowel prep again. If vomiting continues and you cannot take the bowel prep without vomiting, call your health care provider.  General instructions  Ask your health care provider about changing or stopping your regular medicines. This is especially important if you are taking diabetes medicines or blood thinners.  Plan to have someone take you home from the hospital or clinic. What happens during the procedure?  An IV tube may be inserted into one of your  veins.  You will be given medicine to help you relax (sedative).  To reduce your risk of infection: ? Your health care team will wash or sanitize their hands. ? Your anal area will be washed with soap.  You will be asked to lie on your side with your knees bent.  Your health care provider will lubricate a long, thin, flexible tube. The tube will have a camera and a light on the end.  The tube will be inserted into your anus.  The tube will be gently eased through your rectum and colon.  Air will be delivered into your colon to keep it open. You may feel some pressure or cramping.  The camera will be used to take images during the procedure.  A small tissue sample may be removed from your body to be examined under a microscope (biopsy). If any potential problems are found, the tissue will be sent to a lab for testing.  If small polyps are found, your health care provider may remove them and have them checked for cancer cells.  The tube that was inserted into your anus will be slowly removed. The procedure may vary among health care providers and hospitals. What happens after the procedure?  Your blood pressure, heart rate, breathing rate, and blood oxygen level will be monitored until the medicines you were given have worn off.  Do not drive for 24 hours after the exam.  You may have a small amount of blood in your stool.  You may pass gas and have mild abdominal cramping or bloating due to the air that was used to inflate your colon during the exam.  It is up to you to get the results of your procedure. Ask your health care provider, or the department performing the procedure, when your results will be ready. This information is not intended to replace advice given to you by your health care provider. Make sure you discuss any questions you have with your health care provider. Document Released: 08/12/2000 Document Revised: 06/15/2016 Document Reviewed: 10/27/2015 Elsevier  Interactive Patient Education  2018 [place] Prevention in the [place] can cause injuries. They can happen to people of all ages. There are many things you can do to make your home safe and to help prevent falls. What can I do on the outside of my home?  Regularly fix the edges of walkways and driveways and fix any cracks.  Remove anything that might make you trip as you walk through a door, such as a raised step or threshold.  Trim any bushes or trees on the path to your home.  Use bright outdoor lighting.  Clear any walking paths of anything that might make someone trip, such as rocks or tools.  Regularly check to see if handrails are loose or broken. Make sure that both sides of any steps have handrails.  Any raised decks and porches should have guardrails on the edges.  Have any leaves, snow, or ice cleared regularly.  Use sand or salt on walking paths during winter.  Clean up any spills in your garage right away. This includes oil or grease spills. What can I do in the bathroom?  Use night lights.  Install grab bars by the toilet and in the tub and shower. Do not use towel bars as grab bars.  Use non-skid mats or decals in the tub or shower.  If you need to sit down in the shower, use a plastic, non-slip stool.  Keep the floor dry. Clean up any water that spills on the floor as soon as it happens.  Remove soap buildup in the tub or shower regularly.  Attach bath mats securely with double-sided non-slip rug tape.  Do not have throw rugs and other things on the floor that can make you trip. What can I do in the bedroom?  Use night lights.  Make sure that you have a light by your bed that is easy to reach.  Do not use any sheets or blankets that are too big for your bed. They should not hang down onto the floor.  Have a firm chair that has side arms. You can use this for support while you get dressed.  Do not have throw rugs and other things on  the floor that can make you trip. What can I do in the kitchen?  Clean up any spills right away.  Avoid walking on wet floors.  Keep items that you use a lot in easy-to-reach places.  If you need to reach something above you, use a strong step stool that has a grab bar.  Keep electrical cords out of the way.  Do not use floor polish or wax that makes floors slippery. If you must use wax, use non-skid floor wax.  Do not have throw rugs and other things on the floor that can make you trip. What can I do with my stairs?  Do not leave any items on the stairs.  Make sure that there are handrails on both sides of the stairs and use them. Fix handrails that are broken or loose. Make sure that handrails are as long as the stairways.  Check any carpeting to make sure that it is firmly attached to the stairs. Fix any carpet that is loose or worn.  Avoid having throw rugs at the top or bottom of the stairs. If you do have throw rugs, attach them to the floor with carpet tape.  Make sure that you have a light switch at the top of the stairs and the bottom of the stairs. If you do not have them, ask someone to add them for you. What else can I do to help prevent falls?  Wear shoes that: ? Do not have high heels. ? Have rubber bottoms. ? Are comfortable and fit you well. ? Are closed at the toe. Do not wear sandals.  If you use a stepladder: ? Make sure that it is fully opened. Do not climb a closed stepladder. ? Make sure that both sides of the stepladder are locked into place. ? Ask someone to hold it for you, if possible.  Clearly mark and make sure that you can see: ? Any grab bars or handrails. ? First and last steps. ? Where the edge of each step is.  Use tools that help you move around (mobility aids) if they are needed. These include: ? Canes. ? Walkers. ? Scooters. ? Crutches.  Turn on the lights when you go into  a dark area. Replace any light bulbs as soon as they burn  out.  Set up your furniture so you have a clear path. Avoid moving your furniture around.  If any of your floors are uneven, fix them.  If there are any pets around you, be aware of where they are.  Review your medicines with your doctor. Some medicines can make you feel dizzy. This can increase your chance of falling. Ask your doctor what other things that you can do to help prevent falls. This information is not intended to replace advice given to you by your health care provider. Make sure you discuss any questions you have with your health care provider. Document Released: 06/11/2009 Document Revised: 01/21/2016 Document Reviewed: 09/19/2014 Elsevier Interactive Patient Education  2018 [place] Maintenance, Female Adopting a healthy lifestyle and getting preventive care can go a long way to promote health and wellness. Talk with your health care provider about what schedule of regular examinations is right for you. This is a good chance for you to check in with your provider about disease prevention and staying healthy. In between checkups, there are plenty of things you can do on your own. Experts have done a lot of research about which lifestyle changes and preventive measures are most likely to keep you healthy. Ask your health care provider for more information. Weight and diet Eat a healthy diet  Be sure to include plenty of vegetables, fruits, low-fat dairy products, and lean protein.  Do not eat a lot of foods high in solid fats, added sugars, or salt.  Get regular exercise. This is one of the most important things you can do for your health. ? Most adults should exercise for at least 150 minutes each week. The exercise should increase your heart rate and make you sweat (moderate-intensity exercise). ? Most adults should also do strengthening exercises at least twice a week. This is in addition to the moderate-intensity exercise.  Maintain a healthy weight  Body  mass index (BMI) is a measurement that can be used to identify possible weight problems. It estimates body fat based on height and weight. Your health care provider can help determine your BMI and help you achieve or maintain a healthy weight.  For females 96 years of age and older: ? A BMI below 18.5 is considered underweight. ? A BMI of 18.5 to 24.9 is normal. ? A BMI of 25 to 29.9 is considered overweight. ? A BMI of 30 and above is considered obese.  Watch levels of cholesterol and blood lipids  You should start having your blood tested for lipids and cholesterol at 72 years of age, then have this test every 5 years.  You may need to have your cholesterol levels checked more often if: ? Your lipid or cholesterol levels are high. ? You are older than 73 years of age. ? You are at high risk for heart disease.  Cancer screening Lung Cancer  Lung cancer screening is recommended for adults 71-10 years old who are at high risk for lung cancer because of a history of smoking.  A yearly low-dose CT scan of the lungs is recommended for people who: ? Currently smoke. ? Have quit within the past 15 years. ? Have at least a 30-pack-year history of smoking. A pack year is smoking an average of one pack of cigarettes a day for 1 year.  Yearly screening should continue until it has been 15 years since you quit.  Yearly screening should stop if you develop a health problem that would prevent you from having lung cancer treatment.  Breast Cancer  Practice breast self-awareness. This means understanding how your breasts normally appear and feel.  It also means doing regular breast self-exams. Let your health care provider know about any changes, no matter how small.  If you are in your 20s or 30s, you should have a clinical breast exam (CBE) by a health care provider every 1-3 years as part of a regular health exam.  If you are 2 or older, have a CBE every year. Also consider having a  breast X-ray (mammogram) every year.  If you have a family history of breast cancer, talk to your health care provider about genetic screening.  If you are at high risk for breast cancer, talk to your health care provider about having an MRI and a mammogram every year.  Breast cancer gene (BRCA) assessment is recommended for women who have family members with BRCA-related cancers. BRCA-related cancers include: ? Breast. ? Ovarian. ? Tubal. ? Peritoneal cancers.  Results of the assessment will determine the need for genetic counseling and BRCA1 and BRCA2 testing.  Cervical Cancer Your health care provider may recommend that you be screened regularly for cancer of the pelvic organs (ovaries, uterus, and vagina). This screening involves a pelvic examination, including checking for microscopic changes to the surface of your cervix (Pap test). You may be encouraged to have this screening done every 3 years, beginning at age 38.  For women ages 81-65, health care providers may recommend pelvic exams and Pap testing every 3 years, or they may recommend the Pap and pelvic exam, combined with testing for human papilloma virus (HPV), every 5 years. Some types of HPV increase your risk of cervical cancer. Testing for HPV may also be done on women of any age with unclear Pap test results.  Other health care providers may not recommend any screening for nonpregnant women who are considered low risk for pelvic cancer and who do not have symptoms. Ask your health care provider if a screening pelvic exam is right for you.  If you have had past treatment for cervical cancer or a condition that could lead to cancer, you need Pap tests and screening for cancer for at least 20 years after your treatment. If Pap tests have been discontinued, your risk factors (such as having a new sexual partner) need to be reassessed to determine if screening should resume. Some women have medical problems that increase the chance  of getting cervical cancer. In these cases, your health care provider may recommend more frequent screening and Pap tests.  Colorectal Cancer  This type of cancer can be detected and often prevented.  Routine colorectal cancer screening usually begins at 72 years of age and continues through 72 years of age.  Your health care provider may recommend screening at an earlier age if you have risk factors for colon cancer.  Your health care provider may also recommend using home test kits to check for hidden blood in the stool.  A small camera at the end of a tube can be used to examine your colon directly (sigmoidoscopy or colonoscopy). This is done to check for the earliest forms of colorectal cancer.  Routine screening usually begins at age 1.  Direct examination of the colon should be repeated every 5-10 years through 72 years of age. However, you may need to be screened more often if early forms of precancerous  polyps or small growths are found.  Skin Cancer  Check your skin from head to toe regularly.  Tell your health care provider about any new moles or changes in moles, especially if there is a change in a mole's shape or color.  Also tell your health care provider if you have a mole that is larger than the size of a pencil eraser.  Always use sunscreen. Apply sunscreen liberally and repeatedly throughout the day.  Protect yourself by wearing long sleeves, pants, a wide-brimmed hat, and sunglasses whenever you are outside.  Heart disease, diabetes, and high blood pressure  High blood pressure causes heart disease and increases the risk of stroke. High blood pressure is more likely to develop in: ? People who have blood pressure in the high end of the normal range (130-139/85-89 mm Hg). ? People who are overweight or obese. ? People who are African American.  If you are 61-26 years of age, have your blood pressure checked every 3-5 years. If you are 106 years of age or older,  have your blood pressure checked every year. You should have your blood pressure measured twice-once when you are at a hospital or clinic, and once when you are not at a hospital or clinic. Record the average of the two measurements. To check your blood pressure when you are not at a hospital or clinic, you can use: ? An automated blood pressure machine at a pharmacy. ? A home blood pressure monitor.  If you are between 37 years and 9 years old, ask your health care provider if you should take aspirin to prevent strokes.  Have regular diabetes screenings. This involves taking a blood sample to check your fasting blood sugar level. ? If you are at a normal weight and have a low risk for diabetes, have this test once every three years after 72 years of age. ? If you are overweight and have a high risk for diabetes, consider being tested at a younger age or more often. Preventing infection Hepatitis B  If you have a higher risk for hepatitis B, you should be screened for this virus. You are considered at high risk for hepatitis B if: ? You were born in a country where hepatitis B is common. Ask your health care provider which countries are considered high risk. ? Your parents were born in a high-risk country, and you have not been immunized against hepatitis B (hepatitis B vaccine). ? You have HIV or AIDS. ? You use needles to inject street drugs. ? You live with someone who has hepatitis B. ? You have had sex with someone who has hepatitis B. ? You get hemodialysis treatment. ? You take certain medicines for conditions, including cancer, organ transplantation, and autoimmune conditions.  Hepatitis C  Blood testing is recommended for: ? Everyone born from [birth year] through 1965. ? Anyone with known risk factors for hepatitis C.  Sexually transmitted infections (STIs)  You should be screened for sexually transmitted infections (STIs) including gonorrhea and chlamydia if: ? You are sexually  active and are younger than 72 years of age. ? You are older than 72 years of age and your health care provider tells you that you are at risk for this type of infection. ? Your sexual activity has changed since you were last screened and you are at an increased risk for chlamydia or gonorrhea. Ask your health care provider if you are at risk.  If you do not have HIV, but are at  risk, it may be recommended that you take a prescription medicine daily to prevent HIV infection. This is called pre-exposure prophylaxis (PrEP). You are considered at risk if: ? You are sexually active and do not regularly use condoms or know the HIV status of your partner(s). ? You take drugs by injection. ? You are sexually active with a partner who has HIV.  Talk with your health care provider about whether you are at high risk of being infected with HIV. If you choose to begin PrEP, you should first be tested for HIV. You should then be tested every 3 months for as long as you are taking PrEP. Pregnancy  If you are premenopausal and you may become pregnant, ask your health care provider about preconception counseling.  If you may become pregnant, take 400 to 800 micrograms (mcg) of folic acid every day.  If you want to prevent pregnancy, talk to your health care provider about birth control (contraception). Osteoporosis and menopause  Osteoporosis is a disease in which the bones lose minerals and strength with aging. This can result in serious bone fractures. Your risk for osteoporosis can be identified using a bone density scan.  If you are 29 years of age or older, or if you are at risk for osteoporosis and fractures, ask your health care provider if you should be screened.  Ask your health care provider whether you should take a calcium or vitamin D supplement to lower your risk for osteoporosis.  Menopause may have certain physical symptoms and risks.  Hormone replacement therapy may reduce some of these  symptoms and risks. Talk to your health care provider about whether hormone replacement therapy is right for you. Follow these instructions at home:  Schedule regular health, dental, and eye exams.  Stay current with your immunizations.  Do not use any tobacco products including cigarettes, chewing tobacco, or electronic cigarettes.  If you are pregnant, do not drink alcohol.  If you are breastfeeding, limit how much and how often you drink alcohol.  Limit alcohol intake to no more than 1 drink per day for nonpregnant women. One drink equals 12 ounces of beer, 5 ounces of wine, or 1 ounces of hard liquor.  Do not use street drugs.  Do not share needles.  Ask your health care provider for help if you need support or information about quitting drugs.  Tell your health care provider if you often feel depressed.  Tell your health care provider if you have ever been abused or do not feel safe at home. This information is not intended to replace advice given to you by your health care provider. Make sure you discuss any questions you have with your health care provider. Document Released: 02/28/2011 Document Revised: 01/21/2016 Document Reviewed: 05/19/2015 Elsevier Interactive Patient Education  2018 [place]   Fall Prevention in the Home Falls can cause injuries. They can happen to people of all ages. There are many things you can do to make your home safe and to help prevent falls. What can I do on the outside of my home?  Regularly fix the edges of walkways and driveways and fix any cracks.  Remove anything that might make you trip as you walk through a door, such as a raised step or threshold.  Trim any bushes or trees on the path to your home.  Use bright outdoor lighting.  Clear any walking paths of anything that might make someone trip, such as rocks or tools.  Regularly  check to see if handrails are loose or broken. Make sure that both sides of any  steps have handrails.  Any raised decks and porches should have guardrails on the edges.  Have any leaves, snow, or ice cleared regularly.  Use sand or salt on walking paths during winter.  Clean up any spills in your garage right away. This includes oil or grease spills. What can I do in the bathroom?  Use night lights.  Install grab bars by the toilet and in the tub and shower. Do not use towel bars as grab bars.  Use non-skid mats or decals in the tub or shower.  If you need to sit down in the shower, use a plastic, non-slip stool.  Keep the floor dry. Clean up any water that spills on the floor as soon as it happens.  Remove soap buildup in the tub or shower regularly.  Attach bath mats securely with double-sided non-slip rug tape.  Do not have throw rugs and other things on the floor that can make you trip. What can I do in the bedroom?  Use night lights.  Make sure that you have a light by your bed that is easy to reach.  Do not use any sheets or blankets that are too big for your bed. They should not hang down onto the floor.  Have a firm chair that has side arms. You can use this for support while you get dressed.  Do not have throw rugs and other things on the floor that can make you trip. What can I do in the kitchen?  Clean up any spills right away.  Avoid walking on wet floors.  Keep items that you use a lot in easy-to-reach places.  If you need to reach something above you, use a strong step stool that has a grab bar.  Keep electrical cords out of the way.  Do not use floor polish or wax that makes floors slippery. If you must use wax, use non-skid floor wax.  Do not have throw rugs and other things on the floor that can make you trip. What can I do with my stairs?  Do not leave any items on the stairs.  Make sure that there are handrails on both sides of the stairs and use them. Fix handrails that are broken or loose. Make sure that handrails are  as long as the stairways.  Check any carpeting to make sure that it is firmly attached to the stairs. Fix any carpet that is loose or worn.  Avoid having throw rugs at the top or bottom of the stairs. If you do have throw rugs, attach them to the floor with carpet tape.  Make sure that you have a light switch at the top of the stairs and the bottom of the stairs. If you do not have them, ask someone to add them for you. What else can I do to help prevent falls?  Wear shoes that: ? Do not have high heels. ? Have rubber bottoms. ? Are comfortable and fit you well. ? Are closed at the toe. Do not wear sandals.  If you use a stepladder: ? Make sure that it is fully opened. Do not climb a closed stepladder. ? Make sure that both sides of the stepladder are locked into place. ? Ask someone to hold it for you, if possible.  Clearly mark and make sure that you can see: ? Any grab bars or handrails. ? First and last  steps. ? Where the edge of each step is.  Use tools that help you move around (mobility aids) if they are needed. These include: ? Canes. ? Walkers. ? Scooters. ? Crutches.  Turn on the lights when you go into a dark area. Replace any light bulbs as soon as they burn out.  Set up your furniture so you have a clear path. Avoid moving your furniture around.  If any of your floors are uneven, fix them.  If there are any pets around you, be aware of where they are.  Review your medicines with your doctor. Some medicines can make you feel dizzy. This can increase your chance of falling. Ask your doctor what other things that you can do to help prevent falls. This information is not intended to replace advice given to you by your health care provider. Make sure you discuss any questions you have with your health care provider. Document Released: 06/11/2009 Document Revised: 01/21/2016 Document Reviewed: 09/19/2014 Elsevier Interactive Patient Education  [employer].

## 2018-07-12 NOTE — Progress Notes (Signed)
Subjective:   Paige Martinez is a 72 y.o. female who presents for Medicare Annual (Subsequent) preventive examination.  Near falls occasionally, has some episodes with her footing or loosing balance, denies dizziness, no passing out episodes, states she is just clumsy and has always been that way  No problems with blood pressure - takes meds, monitors at home runs 118/60-130/80, no SE  Review of Systems:  Review of Systems  Constitutional: Negative.  Negative for activity change, appetite change, fatigue and unexpected weight change.  HENT: Negative.   Eyes: Negative.   Respiratory: Negative.  Negative for shortness of breath.   Cardiovascular: Negative.  Negative for chest pain, palpitations and leg swelling.  Gastrointestinal: Negative.  Negative for abdominal pain and blood in stool.  Endocrine: Negative.   Genitourinary: Negative.   Musculoskeletal: Negative.  Negative for arthralgias, gait problem, joint swelling and myalgias.  Skin: Negative.  Negative for color change, pallor and rash.  Allergic/Immunologic: Negative.   Neurological: Negative.  Negative for syncope and weakness.  Hematological: Negative.   Psychiatric/Behavioral: Negative.  Negative for confusion, dysphoric mood, self-injury and suicidal ideas. The patient is not nervous/anxious.     Cardiac Risk Factors include: advanced age (>57men, >1 women);hypertension;sedentary lifestyle     Objective:     Vitals: BP 132/64   Pulse 82   Temp 97.8 F (36.6 C) (Oral)   Resp 16   Ht 5\' 9"  (1.753 m)   Wt 202 lb (91.6 kg)   SpO2 98%   BMI 29.83 kg/m   Body mass index is 29.83 kg/m.  Advanced Directives 07/12/2018  Does Patient Have a Medical Advance Directive? No  Would patient like information on creating a medical advance directive? Yes (MAU/Ambulatory/Procedural Areas - Information given)    Tobacco Social History   Tobacco Use  Smoking Status Never Smoker  Smokeless Tobacco Never Used       Counseling given: Not Answered   Clinical Intake:  Pre-visit preparation completed: Yes  Pain : No/denies pain     BMI - recorded: 29 Nutritional Status: BMI 25 -29 Overweight Nutritional Risks: None  How often do you need to have someone help you when you read instructions, pamphlets, or other written materials from your doctor or pharmacy?: 1 - Never What is the last grade level you completed in school?: some college  Interpreter Needed?: No  Information entered by :: [employer], LPN  Past Medical History:  Diagnosis Date  . Hypertension   . 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 Vitamin D deficiency 04/18/2008   Past Surgical History:  Procedure Laterality Date  . TUBAL LIGATION     Family History  Problem Relation Age of Onset  . Alcohol abuse Father   . Cancer Father 21       Pancreatic Cancer  . Hypertension Sister   . Hypertension Brother    Social History   Socioeconomic History  . Marital status: Divorced    Spouse name: Not on file  . Number of children: Not on file  . Years of education: Not on file  . Highest education level: Not on file  Occupational History  . Occupation: Probation officer Dept  Social Needs  . Financial resource strain: Not on file  . Food insecurity:    Worry: Not on file    Inability: Not on file  . Transportation needs:    Medical: Not on file    Non-medical: Not on file  Tobacco Use  . Smoking status: Never Smoker  .  Smokeless tobacco: Never Used  Substance and Sexual Activity  . Alcohol use: No  . Drug use: No  . Sexual activity: Not on file  Lifestyle  . Physical activity:    Days per week: Not on file    Minutes per session: Not on file  . Stress: Not on file  Relationships  . Social connections:    Talks on phone: Not on file    Gets together: Not on file    Attends religious service: Not on file    Active member of club or organization: Not on file    Attends meetings of clubs or organizations: Not on file    Relationship  status: Not on file  Other Topics Concern  . Not on file  Social History Narrative   Divorced.    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   Rides Stationary Bike 4 miles 4 days per week. --20 minutes    Outpatient Encounter Medications as of 07/12/2018  Medication Sig  . Calcium Citrate-Vitamin D (CALCIUM + D PO) Take by mouth. Pt told to take 1200 - 1500 mg daily in divided doses  . cholecalciferol (VITAMIN D) 1000 UNITS tablet Take 1,000 Units by mouth daily.  Marland Kitchen lisinopril (PRINIVIL,ZESTRIL) 10 MG tablet TAKE ONE TABLET BY MOUTH ONCE DAILY  . Multiple Vitamin (MULTIVITAMIN) tablet Take 1 tablet by mouth daily.  . [EXPIRED] Zoster Vaccine Adjuvanted [facility]) injection Inject 0.5 mLs into the muscle once for 1 dose. Repeat immunization in 2-6 months.   No facility-administered encounter medications on file as of 07/12/2018.     Activities of Daily Living In your present state of health, do you have any difficulty performing the following activities: 07/12/2018  Hearing? Y  Comment soft whispered voices  Vision? N  Difficulty concentrating or making decisions? N  Walking or climbing stairs? Y  Comment stiffness to knees  Dressing or bathing? N  Doing errands, shopping? N  Preparing Food and eating ? N  Using the Toilet? N  In the past six months, have you accidently leaked urine? N  Do you have problems with loss of bowel control? N  Managing your Medications? N  Managing your Finances? N  Housekeeping or managing your Housekeeping? N  Some recent data might be hidden    Patient Care Team: Delsa Grana, PA-C as PCP - General (Family Medicine)    Assessment:   This is a routine wellness examination for [place].  Exercise Activities and Dietary recommendations Current Exercise Habits: The patient does not participate in regular exercise at present, Exercise limited by: orthopedic condition(s)  Goals   None     Fall  Risk Fall Risk  07/12/2018 07/12/2018 06/21/2017 06/16/2016 06/11/2015  Falls in the past year? 1 0 No Yes No  Number falls in past yr: 0 - - 1 -  Injury with Fall? 0 - - No -  Risk for fall due to : History of fall(s) - - - -  Follow up Falls evaluation completed Falls evaluation completed - - -   Is the patient's home free of loose throw rugs in walkways, pet beds, electrical cords, etc?   yes      Grab bars in the bathroom? yes      Handrails on the stairs?   yes      Adequate lighting?   yes   Depression Screen PHQ 2/9 Scores 07/12/2018 06/21/2017 06/16/2016 06/11/2015  PHQ - 2 Score 2 2 0 0  PHQ- 9 Score 6 7 - -     Cognitive Function  Alert? Yes  Normal Appearance?Yes  Oriented to person? Yes  Place? Yes  Time? Yes  Recall of three objects? Yes  Can perform simple calculations? Yes  Displays appropriate judgment?Yes  Can read the correct time from a watch face?Yes          Immunization History  Administered Date(s) Administered  . Influenza Whole 05/29/2013  . Influenza, High Dose Seasonal PF 06/22/2018  . Influenza,inj,Quad PF,6+ Mos 06/05/2014, 06/11/2015, 06/16/2016, 06/21/2017  . Pneumococcal Conjugate-13 06/05/2014  . Pneumococcal Polysaccharide-23 04/17/2009  . Tdap 06/27/2013  . Zoster 06/10/2011  Needed pneumococcal 23 in 2015    Qualifies for Shingles Vaccine? - yes sent to pharmacy  Screening Tests Health Maintenance  Topic Date Due  . COLONOSCOPY  12/04/1995  . PNA vac Low Risk Adult (2 of 2 - PPSV23) 06/06/2015  . MAMMOGRAM  08/16/2019  . TETANUS/TDAP  06/28/2023  . INFLUENZA VACCINE  Completed  . DEXA SCAN  Completed  . Hepatitis C Screening  Completed    Cancer Screenings: Lung: Low Dose CT Chest recommended if Age 90-80 years, 30 pack-year currently smoking OR have quit w/in 15years. Patient does not qualify. Breast:  Up to date on Mammogram? Yes   Up to date of Bone Density/Dexa? Yes Colorectal: Due - pt prefers to look into  cologuard      Plan:      Problem List Items Addressed This Visit      Cardiovascular and Mediastinum   Hypertension    Well controlled with lisinopril, no SE, recheck renal function      Relevant Orders   Lipid panel (Completed)   COMPLETE METABOLIC PANEL WITH GFR (Completed)   CBC with Differential (Completed)     Musculoskeletal and Integument   Osteopenia    Pt supplementing with calcium and Vit D, encouraged weight bearing exercises        Other   Vitamin D deficiency    Recheck labs      Relevant Orders   VITAMIN D 25 Hydroxy (Vit-D Deficiency, Fractures) (Completed)    Other Visit Diagnoses    Encounter for Medicare annual wellness exam    -  Primary   Overweight (BMI 25.0-29.9)       encouraged healthy diet and exercise as tolerated, she endorsed feeling tired, will screen thyroid   Relevant Orders   Lipid panel (Completed)   COMPLETE METABOLIC PANEL WITH GFR (Completed)   TSH (Completed)   CBC with Differential (Completed)   Abnormal thyroid function test       hx of thyroid dysfunction, recheck labs   Relevant Orders   TSH (Completed)   Screening for malignant neoplasm of breast       due for mammogram next year   Relevant Orders   MM Digital Screening        I have personally reviewed and noted the following in the patient's chart:   . Medical and social history . Use of alcohol, tobacco or illicit drugs  . Current medications and supplements . Functional ability and status . Nutritional status . Physical activity . Advanced directives . List of other physicians . Hospitalizations, surgeries, and ER visits in previous 12 months . Vitals . Screenings to include cognitive, depression, and falls . Referrals and appointments  In addition, I have reviewed and discussed with patient certain preventive protocols, quality metrics, and  best practice recommendations. A written personalized care plan for preventive services as well as general  preventive health recommendations were provided to patient.     Delsa Grana, PA-C  07/12/2018

## 2018-07-13 LAB — CBC WITH DIFFERENTIAL/PLATELET
BASOS ABS: 60 {cells}/uL (ref 0–200)
BASOS PCT: 0.6 %
EOS ABS: 280 {cells}/uL (ref 15–500)
Eosinophils Relative: 2.8 %
HCT: 42.6 % (ref 35.0–45.0)
Hemoglobin: 14.6 g/dL (ref 11.7–15.5)
Lymphs Abs: 1670 cells/uL (ref 850–3900)
MCH: 29.4 pg (ref 27.0–33.0)
MCHC: 34.3 g/dL (ref 32.0–36.0)
MCV: 85.7 fL (ref 80.0–100.0)
MPV: 12.2 fL (ref 7.5–12.5)
Monocytes Relative: 5.4 %
Neutro Abs: 7450 cells/uL (ref 1500–7800)
Neutrophils Relative %: 74.5 %
Platelets: 323 10*3/uL (ref 140–400)
RBC: 4.97 10*6/uL (ref 3.80–5.10)
RDW: 12.7 % (ref 11.0–15.0)
Total Lymphocyte: 16.7 %
WBC: 10 10*3/uL (ref 3.8–10.8)
WBCMIX: 540 {cells}/uL (ref 200–950)

## 2018-07-13 LAB — LIPID PANEL
CHOL/HDL RATIO: 3.3 (calc) (ref <5.0)
CHOLESTEROL: 197 mg/dL (ref <200)
HDL: 59 mg/dL (ref >50)
LDL Cholesterol (Calc): 117 mg/dL (calc) — ABNORMAL HIGH
Non-HDL Cholesterol (Calc): 138 mg/dL (calc) — ABNORMAL HIGH (ref <130)
Triglycerides: 104 mg/dL (ref <150)

## 2018-07-13 LAB — COMPLETE METABOLIC PANEL WITH GFR
AG RATIO: 1.7 (calc) (ref 1.0–2.5)
ALBUMIN MSPROF: 4.1 g/dL (ref 3.6–5.1)
ALT: 15 U/L (ref 6–29)
AST: 16 U/L (ref 10–35)
Alkaline phosphatase (APISO): 120 U/L (ref 33–130)
BUN: 16 mg/dL (ref 7–25)
CALCIUM: 9.6 mg/dL (ref 8.6–10.4)
CO2: 28 mmol/L (ref 20–32)
CREATININE: 0.77 mg/dL (ref 0.60–0.93)
Chloride: 106 mmol/L (ref 98–110)
GFR, EST AFRICAN AMERICAN: 89 mL/min/{1.73_m2} (ref >=60)
GFR, EST NON AFRICAN AMERICAN: 77 mL/min/{1.73_m2} (ref >=60)
GLOBULIN: 2.4 g/dL (ref 1.9–3.7)
Glucose, Bld: 95 mg/dL (ref 65–99)
POTASSIUM: 4.9 mmol/L (ref 3.5–5.3)
Sodium: 143 mmol/L (ref 135–146)
TOTAL PROTEIN: 6.5 g/dL (ref 6.1–8.1)
Total Bilirubin: 0.4 mg/dL (ref 0.2–1.2)

## 2018-07-13 LAB — VITAMIN D 25 HYDROXY (VIT D DEFICIENCY, FRACTURES): VIT D 25 HYDROXY: 57 ng/mL (ref 30–100)

## 2018-07-13 LAB — TSH: TSH: 3.58 m[IU]/L (ref 0.40–4.50)

## 2018-07-14 ENCOUNTER — Telehealth: Payer: Self-pay | Admitting: *Deleted

## 2018-07-14 NOTE — Telephone Encounter (Signed)
Received verbal orders for Cologuard.   Order placed via [facility].   Cologuard (Order [phone])

## 2018-07-17 ENCOUNTER — Encounter: Payer: Self-pay | Admitting: Family Medicine

## 2018-07-17 NOTE — Assessment & Plan Note (Signed)
Pt supplementing with calcium and Vit D, encouraged weight bearing exercises

## 2018-07-17 NOTE — Assessment & Plan Note (Signed)
Well controlled with lisinopril, no SE, recheck renal function

## 2018-07-17 NOTE — Assessment & Plan Note (Signed)
Recheck labs 

## 2018-07-24 ENCOUNTER — Ambulatory Visit (INDEPENDENT_AMBULATORY_CARE_PROVIDER_SITE_OTHER): Payer: Medicare Other | Admitting: Family Medicine

## 2018-07-24 ENCOUNTER — Encounter: Payer: Self-pay | Admitting: Family Medicine

## 2018-07-24 DIAGNOSIS — E782 Mixed hyperlipidemia: Secondary | ICD-10-CM | POA: Diagnosis not present

## 2018-07-24 DIAGNOSIS — E785 Hyperlipidemia, unspecified: Secondary | ICD-10-CM | POA: Insufficient documentation

## 2018-07-24 DIAGNOSIS — I10 Essential (primary) hypertension: Secondary | ICD-10-CM

## 2018-07-24 MED ORDER — LISINOPRIL 5 MG PO TABS: 5.0000 mg | ORAL_TABLET | Freq: Every day | ORAL | 3 refills | Status: DC

## 2018-07-24 NOTE — Assessment & Plan Note (Signed)
Continue to monitor BP If continues to be low SBP 90-100, can decrease lisinopril to 5 mg daily Diet, exercise, 6 month recheck

## 2018-07-24 NOTE — Patient Instructions (Addendum)
The 10-year ASCVD risk score Mikey Bussing DC Brooke Bonito., et al., 2013) is: 11.5%   Values used to calculate the score:     Age: 72 years     Sex: Female     Is Non-Hispanic African American: No     Diabetic: No     Tobacco smoker: No     Systolic Blood Pressure: 694 mmHg     Is BP treated: Yes     HDL Cholesterol: 59 mg/dL     Total Cholesterol: 197 mg/dL  Total Cholesterol 197 LDL (bad cholesterol) 117 - goal is <70   Fat and Cholesterol Restricted Diet Getting too much fat and cholesterol in your diet may cause health problems. Following this diet helps keep your fat and cholesterol at normal levels. This can keep you from getting sick. What types of fat should I choose?  Choose monosaturated and polyunsaturated fats. These are found in foods such as olive oil, canola oil, flaxseeds, walnuts, almonds, and seeds.  Eat more omega-3 fats. Good choices include salmon, mackerel, sardines, tuna, flaxseed oil, and ground flaxseeds.  Limit saturated fats. These are in animal products such as meats, butter, and cream. They can also be in plant products such as palm oil, palm kernel oil, and coconut oil.  Avoid foods with partially hydrogenated oils in them. These contain trans fats. Examples of foods that have trans fats are stick margarine, some tub margarines, cookies, crackers, and other baked goods. What general guidelines do I need to follow?  Check food labels. Look for the words "trans fat" and "saturated fat."  When preparing a meal: ? Fill half of your plate with vegetables and green salads. ? Fill one fourth of your plate with whole grains. Look for the word "whole" as the first word in the ingredient list. ? Fill one fourth of your plate with lean protein foods.  Eat more foods that have fiber, like apples, carrots, beans, peas, and barley.  Eat more home-cooked foods. Eat less at restaurants and buffets.  Limit or avoid alcohol.  Limit foods high in starch and sugar.  Limit fried  foods.  Cook foods without frying them. Baking, boiling, grilling, and broiling are all great options.  Lose weight if you are overweight. Losing even a small amount of weight can help your overall health. It can also help prevent diseases such as diabetes and heart disease. What foods can I eat? Grains Whole grains, such as whole wheat or whole grain breads, crackers, cereals, and pasta. Unsweetened oatmeal, bulgur, barley, quinoa, or brown rice. Corn or whole wheat flour tortillas. Vegetables Fresh or frozen vegetables (raw, steamed, roasted, or grilled). Green salads. Fruits All fresh, canned (in natural juice), or frozen fruits. Meat and Other Protein Products Ground beef (85% or leaner), grass-fed beef, or beef trimmed of fat. Skinless chicken or [place]. Ground chicken or [place]. Pork trimmed of fat. All fish and seafood. Eggs. Dried beans, peas, or lentils. Unsalted nuts or seeds. Unsalted canned or dry beans. Dairy Low-fat dairy products, such as skim or 1% milk, 2% or reduced-fat cheeses, low-fat ricotta or cottage cheese, or plain low-fat yogurt. Fats and Oils Tub margarines without trans fats. Light or reduced-fat mayonnaise and salad dressings. Avocado. Olive, canola, sesame, or safflower oils. Natural peanut or almond butter (choose ones without added sugar and oil). The items listed above may not be a complete list of recommended foods or beverages. Contact your dietitian for more options. What foods are not recommended? Grains White bread.  White pasta. White rice. Cornbread. Bagels, pastries, and croissants. Crackers that contain trans fat. Vegetables White potatoes. Corn. Creamed or fried vegetables. Vegetables in a cheese sauce. Fruits Dried fruits. Canned fruit in light or heavy syrup. Fruit juice. Meat and Other Protein Products Fatty cuts of meat. Ribs, chicken wings, bacon, sausage, bologna, salami, chitterlings, fatback, hot dogs, bratwurst, and packaged luncheon  meats. Liver and organ meats. Dairy Whole or 2% milk, cream, half-and-half, and cream cheese. Whole milk cheeses. Whole-fat or sweetened yogurt. Full-fat cheeses. Nondairy creamers and whipped toppings. Processed cheese, cheese spreads, or cheese curds. Sweets and Desserts Corn syrup, sugars, honey, and molasses. Candy. Jam and jelly. Syrup. Sweetened cereals. Cookies, pies, cakes, donuts, muffins, and ice cream. Fats and Oils Butter, stick margarine, lard, shortening, ghee, or bacon fat. Coconut, palm kernel, or palm oils. Beverages Alcohol. Sweetened drinks (such as sodas, lemonade, and fruit drinks or punches). The items listed above may not be a complete list of foods and beverages to avoid. Contact your dietitian for more information. This information is not intended to replace advice given to you by your health care provider. Make sure you discuss any questions you have with your health care provider. Document Released: 02/14/2012 Document Revised: 04/21/2016 Document Reviewed: 11/14/2013 Elsevier Interactive Patient Education  2018 [employer].  Cholesterol Cholesterol is a fat. Your body needs a small amount of cholesterol. Cholesterol (plaque) may build up in your blood vessels (arteries). That makes you more likely to have a heart attack or stroke. You cannot feel your cholesterol level. Having a blood test is the only way to find out if your level is high. Keep your test results. Work with your doctor to keep your cholesterol at a good level. What do the results mean?  Total cholesterol is how much cholesterol is in your blood.  LDL is bad cholesterol. This is the type that can build up. Try to have low LDL.  HDL is good cholesterol. It cleans your blood vessels and carries LDL away. Try to have high HDL.  Triglycerides are fat that the body can store or burn for energy. What are good levels of cholesterol?  Total cholesterol below 200.  LDL below 100 is good for people who  have health risks. LDL below 70 is good for people who have very high risks.  HDL above 40 is good. It is best to have HDL of 60 or higher.  Triglycerides below 150. How can I lower my cholesterol? Diet Follow your diet program as told by your doctor.  Choose fish, white meat chicken, or [place] that is roasted or baked. Try not to eat red meat, fried foods, sausage, or lunch meats.  Eat lots of fresh fruits and vegetables.  Choose whole grains, beans, pasta, potatoes, and cereals.  Choose olive oil, corn oil, or canola oil. Only use small amounts.  Try not to eat butter, mayonnaise, shortening, or palm kernel oils.  Try not to eat foods with trans fats.  Choose low-fat or nonfat dairy foods. ? Drink skim or nonfat milk. ? Eat low-fat or nonfat yogurt and cheeses. ? Try not to drink whole milk or cream. ? Try not to eat ice cream, egg yolks, or full-fat cheeses.  Healthy desserts include angel food cake, ginger snaps, animal crackers, hard candy, popsicles, and low-fat or nonfat frozen yogurt. Try not to eat pastries, cakes, pies, and cookies.  Exercise Follow your exercise program as told by your doctor.  Be more active. Try gardening, walking,  and taking the stairs.  Ask your doctor about ways that you can be more active.  Medicine  Take over-the-counter and prescription medicines only as told by your doctor. This information is not intended to replace advice given to you by your health care provider. Make sure you discuss any questions you have with your health care provider. Document Released: 11/11/2008 Document Revised: 03/16/2016 Document Reviewed: 02/25/2016 Elsevier Interactive Patient Education  [employer].

## 2018-07-24 NOTE — Progress Notes (Signed)
Patient ID: Paige Martinez, female    DOB: [birth year], 72 y.o.   MRN: [id number]  PCP: Delsa Grana, PA-C  Chief Complaint  Patient presents with  . Hyperlipidemia    Patient in today to discuss diet and lifestyle changes to lower lipids    Subjective:   Paige Martinez is a 72 y.o. female, presents to clinic with CC of hyperlipidemia - she comes to review labs after well visit, and discuss options to avoid using statin.  Per ASCVD risk calculator - it is indicated to start a statin. Pt would like to work on diet, exercise, weight loss and see if that helps instead of medicines.  She additionally wants to stop taking lisinopril if she can.   Lipid/Cholesterol, Follow-up:   Last Lipid Panel:    Component Value Date/Time   CHOL 197 07/12/2018 0930   TRIG 104 07/12/2018 0930   HDL 59 07/12/2018 0930   CHOLHDL 3.3 07/12/2018 0930   VLDL 19 06/16/2016 0917   LDLCALC 117 (H) 07/12/2018 0930   The 10-year ASCVD risk score Mikey Bussing DC Jr., et al., 2013) is: 11.5%   Values used to calculate the score:     Age: 77 years     Sex: Female     Is Non-Hispanic African American: No     Diabetic: No     Tobacco smoker: No     Systolic Blood Pressure: 774 mmHg     Is BP treated: Yes     HDL Cholesterol: 59 mg/dL     Total Cholesterol: 197 mg/dL  Wt Readings from Last 3 Encounters:  07/24/18 201 lb 6 oz (91.3 kg)  07/12/18 202 lb (91.6 kg)  06/21/17 208 lb 6.4 oz (94.5 kg)   She refuses statin.  She is not currently exercising.  She is not working on a particular diet, but feels that she doesn't eat that much and is just more sedentary than she used to be.    Hypertension, follow-up: She would like to stop taking lisinopril 10 mg, or decrease to 5 mg.    BP Readings from Last 3 Encounters:  07/24/18 110/76  07/12/18 132/64  06/21/17 140/82   She was monitoring her BP at home and it was low 97/67, and had been that way for about 30 reading, she was going to discuss with her  PCP, however before her [place] it started to increase again, although nothing had changed - BP increased to around 140/80.  She did not address and well visit.     Patient Active Problem List   Diagnosis Date Noted  . Osteopenia 08/05/2015  . Hypertension   . Vitamin D deficiency 04/18/2008    Current Meds  Medication Sig  . Calcium Citrate-Vitamin D (CALCIUM + D PO) Take by mouth. Pt told to take 1200 - 1500 mg daily in divided doses  . cholecalciferol (VITAMIN D) 1000 UNITS tablet Take 1,000 Units by mouth daily.  Marland Kitchen lisinopril (PRINIVIL,ZESTRIL) 10 MG tablet TAKE ONE TABLET BY MOUTH ONCE DAILY  . Multiple Vitamin (MULTIVITAMIN) tablet Take 1 tablet by mouth daily.     Review of Systems  Constitutional: Negative.   HENT: Negative.   Eyes: Negative.   Respiratory: Negative.   Cardiovascular: Negative.   Gastrointestinal: Negative.   Endocrine: Negative.   Genitourinary: Negative.   Musculoskeletal: Negative.   Skin: Negative.   Allergic/Immunologic: Negative.   Neurological: Negative.   Hematological: Negative.   Psychiatric/Behavioral: Negative.   All other systems  reviewed and are negative.      Objective:    Vitals:   07/24/18 0917  BP: 110/76  Pulse: 66  Resp: 16  Temp: 97.6 F (36.4 C)  TempSrc: Oral  SpO2: 96%  Weight: 201 lb 6 oz (91.3 kg)      Physical Exam  Constitutional: She appears well-developed and well-nourished. No distress.  HENT:  Head: Normocephalic and atraumatic.  Nose: Nose normal.  Eyes: Conjunctivae are normal. Right eye exhibits no discharge. Left eye exhibits no discharge.  Neck: No tracheal deviation present.  Cardiovascular: Normal rate and regular rhythm.  Pulmonary/Chest: Effort normal. No stridor. No respiratory distress.  Musculoskeletal: Normal range of motion.  Neurological: She is alert. She exhibits normal muscle tone. Coordination normal.  Skin: Skin is warm and dry. No rash noted. She is not diaphoretic.  Psychiatric:  She has a normal mood and affect. Her behavior is normal.  Nursing note and vitals reviewed.         Assessment & Plan:   Problem List Items Addressed This Visit      Cardiovascular and Mediastinum   Hypertension    Continue to monitor BP If continues to be low SBP 90-100, can decrease lisinopril to 5 mg daily Diet, exercise, 6 month recheck      Relevant Medications   lisinopril (PRINIVIL,ZESTRIL) 5 MG tablet   Other Relevant Orders   COMPLETE METABOLIC PANEL WITH GFR   Lipid panel     Other   Hyperlipidemia    Refused statin, reviewed risk of ASCVD Handout given and pt educated on diet for cholesterol from AHA, also AHA exercise recommendations Recheck BP and weight, FLP in 3-4 months, pt would like to do in 1 year, compromised for 6 month recheck      Relevant Medications   lisinopril (PRINIVIL,ZESTRIL) 5 MG tablet   Other Relevant Orders   COMPLETE METABOLIC PANEL WITH GFR   Lipid panel      Pt returns to review labs and medical recommendations to start statin, she wanted to discuss other options to lower cholesterol  I did run the ASCVD risk calculator a few times with much lower LDL numbers, and her age and HTN continue to make risk > 7.5% and statin is still indicated.  I did discuss this with the pt who then said she was concerned with low BP and wanted to try and decrease or D/C lisinopril.  I encouraged her to work on diet and exercise and weight loss because it would help with both.  Encouraged her to do this for several months and return in 3 to 4 months for recheck of her weight, blood pressure and labs but she would like to come in a year and we compromised with a six-month recheck visit.      Delsa Grana, PA-C 07/24/18 9:27 AM

## 2018-07-24 NOTE — Assessment & Plan Note (Signed)
Refused statin, reviewed risk of ASCVD Handout given and pt educated on diet for cholesterol from AHA, also AHA exercise recommendations Recheck BP and weight, FLP in 3-4 months, pt would like to do in 1 year, compromised for 6 month recheck

## 2018-08-17 DIAGNOSIS — Z1231 Encounter for screening mammogram for malignant neoplasm of breast: Secondary | ICD-10-CM | POA: Diagnosis not present

## 2018-08-17 LAB — HM MAMMOGRAPHY

## 2018-08-30 ENCOUNTER — Encounter: Payer: Self-pay | Admitting: *Deleted

## 2018-09-03 ENCOUNTER — Encounter: Payer: Self-pay | Admitting: Family Medicine

## 2018-09-03 DIAGNOSIS — Z1212 Encounter for screening for malignant neoplasm of rectum: Secondary | ICD-10-CM | POA: Diagnosis not present

## 2018-09-03 DIAGNOSIS — Z1211 Encounter for screening for malignant neoplasm of colon: Secondary | ICD-10-CM | POA: Diagnosis not present

## 2018-09-04 LAB — COLOGUARD: Cologuard: POSITIVE — AB

## 2018-09-12 ENCOUNTER — Telehealth: Payer: Self-pay | Admitting: Family Medicine

## 2018-09-12 DIAGNOSIS — R195 Other fecal abnormalities: Secondary | ICD-10-CM

## 2018-09-12 NOTE — Telephone Encounter (Signed)
Spoke with patient and informed her of colorguard results and referral to gastroenterology. Patient verbalized understanding.

## 2018-09-12 NOTE — Telephone Encounter (Signed)
Left message for patient to return call to inform her that she had a positive colorguard and we are referring her to Gastroenterology.

## 2018-09-14 ENCOUNTER — Encounter: Payer: Self-pay | Admitting: Internal Medicine

## 2018-09-19 ENCOUNTER — Other Ambulatory Visit: Payer: Self-pay

## 2018-11-08 DIAGNOSIS — H25813 Combined forms of age-related cataract, bilateral: Secondary | ICD-10-CM | POA: Diagnosis not present

## 2018-11-08 DIAGNOSIS — H52223 Regular astigmatism, bilateral: Secondary | ICD-10-CM | POA: Diagnosis not present

## 2018-11-22 ENCOUNTER — Ambulatory Visit: Payer: Self-pay | Admitting: Gastroenterology

## 2018-12-12 ENCOUNTER — Other Ambulatory Visit (HOSPITAL_COMMUNITY): Payer: Self-pay

## 2018-12-14 ENCOUNTER — Encounter (HOSPITAL_COMMUNITY): Admission: RE | Payer: Self-pay | Source: Home / Self Care

## 2018-12-14 ENCOUNTER — Ambulatory Visit (HOSPITAL_COMMUNITY): Admission: RE | Admit: 2018-12-14 | Payer: Self-pay | Source: Home / Self Care | Admitting: Ophthalmology

## 2018-12-14 SURGERY — PHACOEMULSIFICATION, CATARACT, WITH IOL INSERTION
Anesthesia: Monitor Anesthesia Care | Laterality: Left

## 2018-12-25 ENCOUNTER — Other Ambulatory Visit (HOSPITAL_COMMUNITY): Payer: Self-pay

## 2018-12-28 ENCOUNTER — Ambulatory Visit (HOSPITAL_COMMUNITY): Admission: RE | Admit: 2018-12-28 | Payer: Self-pay | Source: Home / Self Care | Admitting: Ophthalmology

## 2018-12-28 ENCOUNTER — Encounter (HOSPITAL_COMMUNITY): Admission: RE | Payer: Self-pay | Source: Home / Self Care

## 2018-12-28 SURGERY — PHACOEMULSIFICATION, CATARACT, WITH IOL INSERTION
Anesthesia: Monitor Anesthesia Care | Laterality: Right

## 2019-01-28 ENCOUNTER — Ambulatory Visit (INDEPENDENT_AMBULATORY_CARE_PROVIDER_SITE_OTHER): Payer: Medicare Other | Admitting: Gastroenterology

## 2019-01-28 ENCOUNTER — Other Ambulatory Visit: Payer: Self-pay

## 2019-01-28 ENCOUNTER — Encounter: Payer: Self-pay | Admitting: Gastroenterology

## 2019-01-28 DIAGNOSIS — R195 Other fecal abnormalities: Secondary | ICD-10-CM

## 2019-01-28 NOTE — Progress Notes (Signed)
Primary Care Physician:  Delsa Grana, PA-C  Primary Gastroenterologist:  Garfield Cornea, MD   Chief Complaint  Patient presents with  . Colonoscopy    +cologuard; last TCS approx 10 yrs ago at [facility]    HPI:  Paige Martinez is a 73 y.o. female here at the request of Delsa Grana, PA-C for further evaluation of positive Cologuard.  Patient reports last colonoscopy about 10 years ago through [facility].  She has been off colonoscopy because of worrying about the bowel prep.  She is not sure that she can recall the prep.  She notes recently Dulcolax caused vomiting.  Typically has 2-3 soft stools daily.  Sometimes feels incomplete.  No melena or rectal bleeding.  She is had some abdominal bloating, improves with passing gas.  Recently started probiotic that seems to help as well.  She is had couple episodes of of bloating followed by several hours of vomiting and diarrhea.  Her last one was in May.  Prior to that he had been 2 years since her last episode.  Denies any upper GI symptoms on routine basis.  No heartburn, dysphagia.  Scheduled for cataract surgery in July.  Had to be rescheduled from April due to COVID-19 pandemic.    Maternal grandmother had colon cancer at age greater than 74.   Current Outpatient Medications  Medication Sig Dispense Refill  . Calcium Citrate-Vitamin D (CALCIUM + D PO) Take by mouth. Pt told to take 1200 - 1500 mg daily in divided doses    . cholecalciferol (VITAMIN D) 1000 UNITS tablet Take 1,000 Units by mouth daily.    Marland Kitchen lisinopril (PRINIVIL,ZESTRIL) 5 MG tablet Take 1 tablet (5 mg total) by mouth daily. 90 tablet 3  . Multiple Vitamin (MULTIVITAMIN) tablet Take 1 tablet by mouth daily.    . Probiotic Product (PROBIOTIC DAILY PO) Take by mouth daily.     No current facility-administered medications for this visit.     Allergies as of 01/28/2019  . (No Known Allergies)    Past Medical History:  Diagnosis Date  . Hypertension   . 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 Vitamin D deficiency 04/18/2008    Past Surgical History:  Procedure Laterality Date  . TONSILLECTOMY    . TUBAL LIGATION      Family History  Problem Relation Age of Onset  . Alcohol abuse Father   . Cancer Father 71       Pancreatic Cancer  . Hypertension Sister   . Hypertension Brother   . Colon cancer Paternal Grandmother        greater than 78 years old    Social History   Socioeconomic History  . Marital status: Divorced    Spouse name: Not on file  . Number of children: Not on file  . Years of education: Not on file  . Highest education level: Not on file  Occupational History  . Occupation: Probation officer Dept  Social Needs  . Financial resource strain: Not on file  . Food insecurity:    Worry: Not on file    Inability: Not on file  . Transportation needs:    Medical: Not on file    Non-medical: Not on file  Tobacco Use  . Smoking status: Never Smoker  . Smokeless tobacco: Never Used  Substance and Sexual Activity  . Alcohol use: No  . Drug use: No  . Sexual activity: Not on file  Lifestyle  . Physical activity:    Days per  week: Not on file    Minutes per session: Not on file  . Stress: Not on file  Relationships  . Social connections:    Talks on phone: Not on file    Gets together: Not on file    Attends religious service: Not on file    Active member of club or organization: Not on file    Attends meetings of clubs or organizations: Not on file    Relationship status: Not on file  . Intimate partner violence:    Fear of current or ex partner: Not on file    Emotionally abused: Not on file    Physically abused: Not on file    Forced sexual activity: Not on file  Other Topics Concern  . Not on file  Social History Narrative   Divorced.    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   Rides Stationary Bike 4 miles 4 days per week. --20 minutes      ROS:  General: Negative for  anorexia, weight loss, fever, chills, fatigue, weakness. Eyes: Negative for vision changes.  ENT: Negative for hoarseness, difficulty swallowing , nasal congestion. CV: Negative for chest pain, angina, palpitations, dyspnea on exertion, peripheral edema.  Respiratory: Negative for dyspnea at rest, dyspnea on exertion, cough, sputum, wheezing.  GI: See history of present illness. GU:  Negative for dysuria, hematuria, urinary incontinence, urinary frequency, nocturnal urination.  MS: Negative for joint pain, low back pain.  Derm: Negative for rash or itching.  Neuro: Negative for weakness, abnormal sensation, seizure, frequent headaches, memory loss, confusion.  Psych: Negative for anxiety, depression, suicidal ideation, hallucinations.  Endo: Negative for unusual weight change.  Heme: Negative for bruising or bleeding. Allergy: Negative for rash or hives.    Physical Examination:  BP 133/83   Pulse 75   Temp (!) 97 F (36.1 C) (Oral)   Ht 5\' 10"  (1.778 m)   Wt 198 lb 12.8 oz (90.2 kg)   BMI 28.52 kg/m    General: Well-nourished, well-developed in no acute distress.  Head: Normocephalic, atraumatic.   Eyes: Conjunctiva pink, no icterus. Mouth: Oropharyngeal mucosa moist and pink , no lesions erythema or exudate. Neck: Supple without thyromegaly, masses, or lymphadenopathy.  Lungs: Clear to auscultation bilaterally.  Heart: Regular rate and rhythm, no murmurs rubs or gallops.  Abdomen: Bowel sounds are normal, nontender, nondistended, no hepatosplenomegaly or masses, no abdominal bruits or    hernia , no rebound or guarding.   Rectal:not Performed Extremities: No lower extremity edema. No clubbing or deformities.  Neuro: Alert and oriented x 4 , grossly normal neurologically.  Skin: Warm and dry, no rash or jaundice.   Psych: Alert and cooperative, normal mood and affect.  Labs: Lab Results  Component Value Date   CREATININE 0.77 07/12/2018   BUN 16 07/12/2018   NA 143  07/12/2018   K 4.9 07/12/2018   CL 106 07/12/2018   CO2 28 07/12/2018   Lab Results  Component Value Date   WBC 10.0 07/12/2018   HGB 14.6 07/12/2018   HCT 42.6 07/12/2018   MCV 85.7 07/12/2018   PLT 323 07/12/2018   Lab Results  Component Value Date   ALT 15 07/12/2018   AST 16 07/12/2018   ALKPHOS 114 06/16/2016   BILITOT 0.4 07/12/2018     Imaging Studies: No results found.

## 2019-01-28 NOTE — Assessment & Plan Note (Signed)
Pleasant 73 year old female presenting to schedule colonoscopy due to positive Cologuard test.  She has some intermittent abdominal bloating off and on for years somewhat improved on probiotics.  Sporadic isolated episodes of bloating associated with vomiting and diarrhea the last 2 were separated by 2 years.  Initially plan for colonoscopy.  I have discussed the risks, alternatives, benefits with regards to but not limited to the risk of reaction to medication, bleeding, infection, perforation and the patient is agreeable to proceed. Written consent to be obtained.

## 2019-01-28 NOTE — Patient Instructions (Signed)
1. Colonoscopy as scheduled. See separate instructions.  

## 2019-01-29 ENCOUNTER — Telehealth: Payer: Self-pay

## 2019-01-29 ENCOUNTER — Other Ambulatory Visit: Payer: Self-pay

## 2019-01-29 DIAGNOSIS — R195 Other fecal abnormalities: Secondary | ICD-10-CM

## 2019-01-29 NOTE — Telephone Encounter (Signed)
Tried to call pt to schedule TCS w/RMR, no answer, LMOAM for return call. Pt was given Clenpiq sample at [facility] yesterday.

## 2019-01-29 NOTE — Progress Notes (Signed)
cc'ed to pcp °

## 2019-01-29 NOTE — Telephone Encounter (Signed)
Pt called office, TCS w/RMR scheduled for 05/01/19 at 8:30am. Orders entered. Instructions mailed.

## 2019-03-04 ENCOUNTER — Other Ambulatory Visit: Payer: Self-pay

## 2019-03-04 ENCOUNTER — Encounter (HOSPITAL_COMMUNITY): Payer: Self-pay

## 2019-03-04 DIAGNOSIS — H25812 Combined forms of age-related cataract, left eye: Secondary | ICD-10-CM | POA: Diagnosis not present

## 2019-03-05 ENCOUNTER — Other Ambulatory Visit: Payer: Self-pay

## 2019-03-05 ENCOUNTER — Encounter (HOSPITAL_COMMUNITY)
Admission: RE | Admit: 2019-03-05 | Discharge: 2019-03-05 | Disposition: A | Discharge status: Home / Self Care | Payer: Medicare Other | Source: Ambulatory Visit | Attending: Ophthalmology | Admitting: Ophthalmology

## 2019-03-08 ENCOUNTER — Other Ambulatory Visit (HOSPITAL_COMMUNITY)
Admission: RE | Admit: 2019-03-08 | Discharge: 2019-03-08 | Disposition: A | Discharge status: Home / Self Care | Payer: Medicare Other | Source: Ambulatory Visit | Attending: Ophthalmology | Admitting: Ophthalmology

## 2019-03-08 ENCOUNTER — Other Ambulatory Visit: Payer: Medicare Other

## 2019-03-08 ENCOUNTER — Other Ambulatory Visit: Payer: Self-pay

## 2019-03-08 DIAGNOSIS — Z01812 Encounter for preprocedural laboratory examination: Secondary | ICD-10-CM | POA: Diagnosis not present

## 2019-03-08 DIAGNOSIS — Z1159 Encounter for screening for other viral diseases: Secondary | ICD-10-CM | POA: Diagnosis not present

## 2019-03-08 LAB — SARS CORONAVIRUS 2 (TAT 6-24 HRS): SARS Coronavirus 2: NEGATIVE

## 2019-03-11 ENCOUNTER — Encounter: Payer: Self-pay | Admitting: Family Medicine

## 2019-03-11 ENCOUNTER — Other Ambulatory Visit: Payer: Self-pay

## 2019-03-11 ENCOUNTER — Ambulatory Visit (HOSPITAL_COMMUNITY): Payer: Medicare Other | Admitting: Anesthesiology

## 2019-03-11 ENCOUNTER — Encounter (HOSPITAL_COMMUNITY)
Admission: RE | Disposition: A | Discharge status: Home / Self Care | Payer: Self-pay | Source: Home / Self Care | Attending: Ophthalmology

## 2019-03-11 ENCOUNTER — Encounter (HOSPITAL_COMMUNITY): Payer: Self-pay | Admitting: *Deleted

## 2019-03-11 ENCOUNTER — Ambulatory Visit (HOSPITAL_COMMUNITY)
Admission: RE | Admit: 2019-03-11 | Discharge: 2019-03-11 | Disposition: A | Discharge status: Home / Self Care | Payer: Medicare Other | Attending: Ophthalmology | Admitting: Ophthalmology

## 2019-03-11 ENCOUNTER — Ambulatory Visit (INDEPENDENT_AMBULATORY_CARE_PROVIDER_SITE_OTHER): Payer: Medicare Other | Admitting: Family Medicine

## 2019-03-11 VITALS — BP 110/70 | HR 62 | Temp 97.8°F | Resp 16 | Ht 70.0 in | Wt 198.0 lb

## 2019-03-11 DIAGNOSIS — I4891 Unspecified atrial fibrillation: Secondary | ICD-10-CM | POA: Diagnosis not present

## 2019-03-11 DIAGNOSIS — Z79899 Other long term (current) drug therapy: Secondary | ICD-10-CM | POA: Insufficient documentation

## 2019-03-11 DIAGNOSIS — I10 Essential (primary) hypertension: Secondary | ICD-10-CM | POA: Diagnosis not present

## 2019-03-11 DIAGNOSIS — I499 Cardiac arrhythmia, unspecified: Secondary | ICD-10-CM | POA: Diagnosis not present

## 2019-03-11 DIAGNOSIS — H25812 Combined forms of age-related cataract, left eye: Secondary | ICD-10-CM | POA: Diagnosis not present

## 2019-03-11 DIAGNOSIS — H2512 Age-related nuclear cataract, left eye: Secondary | ICD-10-CM | POA: Diagnosis not present

## 2019-03-11 DIAGNOSIS — H259 Unspecified age-related cataract: Secondary | ICD-10-CM | POA: Diagnosis not present

## 2019-03-11 HISTORY — PX: CATARACT EXTRACTION W/PHACO: SHX586

## 2019-03-11 SURGERY — PHACOEMULSIFICATION, CATARACT, WITH IOL INSERTION
Anesthesia: Monitor Anesthesia Care | Site: Eye | Laterality: Left

## 2019-03-11 MED ORDER — BSS IO SOLN
INTRAOCULAR | Status: DC | PRN
  Administered 2019-03-11: 15 mL

## 2019-03-11 MED ORDER — EPINEPHRINE PF 1 MG/ML IJ SOLN
INTRAOCULAR | Status: DC | PRN
  Administered 2019-03-11: 500 mL

## 2019-03-11 MED ORDER — TETRACAINE HCL 0.5 % OP SOLN
1.0000 [drp] | OPHTHALMIC | Status: AC
  Administered 2019-03-11 (×3): 1 [drp] via OPHTHALMIC

## 2019-03-11 MED ORDER — PHENYLEPHRINE HCL 2.5 % OP SOLN
1.0000 [drp] | OPHTHALMIC | Status: AC
  Administered 2019-03-11 (×3): 1 [drp] via OPHTHALMIC

## 2019-03-11 MED ORDER — CYCLOPENTOLATE-PHENYLEPHRINE 0.2-1 % OP SOLN
1.0000 [drp] | OPHTHALMIC | Status: AC
  Administered 2019-03-11 (×3): 1 [drp] via OPHTHALMIC

## 2019-03-11 MED ORDER — SODIUM HYALURONATE 23 MG/ML IO SOLN
INTRAOCULAR | Status: DC | PRN
  Administered 2019-03-11: 0.6 mL via INTRAOCULAR

## 2019-03-11 MED ORDER — METOPROLOL SUCCINATE ER 25 MG PO TB24: 25.0000 mg | ORAL_TABLET | Freq: Every day | ORAL | 3 refills | Status: DC

## 2019-03-11 MED ORDER — LIDOCAINE HCL (PF) 1 % IJ SOLN
INTRAOCULAR | Status: DC | PRN
  Administered 2019-03-11: 1 mL via OPHTHALMIC

## 2019-03-11 MED ORDER — LIDOCAINE HCL 3.5 % OP GEL
1.0000 "application " | Freq: Once | OPHTHALMIC | Status: AC
  Administered 2019-03-11: 1 via OPHTHALMIC

## 2019-03-11 MED ORDER — NEOMYCIN-POLYMYXIN-DEXAMETH 3.5-10000-0.1 OP SUSP
OPHTHALMIC | Status: DC | PRN
  Administered 2019-03-11: 2 [drp] via OPHTHALMIC

## 2019-03-11 MED ORDER — PROVISC 10 MG/ML IO SOLN
INTRAOCULAR | Status: DC | PRN
  Administered 2019-03-11: 0.85 mL via INTRAOCULAR

## 2019-03-11 MED ORDER — LACTATED RINGERS IV SOLN: INTRAVENOUS | Status: DC

## 2019-03-11 MED ORDER — POVIDONE-IODINE 5 % OP SOLN
OPHTHALMIC | Status: DC | PRN
  Administered 2019-03-11: 1 via OPHTHALMIC

## 2019-03-11 SURGICAL SUPPLY — 15 items

## 2019-03-11 NOTE — Transfer of Care (Signed)
Immediate Anesthesia Transfer of Care Note  Patient: Paige Martinez  Procedure(s) Performed: CATARACT EXTRACTION PHACO AND INTRAOCULAR LENS PLACEMENT LEFT EYE (Left Eye)  Patient Location: PACU  Anesthesia Type:MAC  Level of Consciousness: awake and patient cooperative  Airway & Oxygen Therapy: Patient Spontanous Breathing  Post-op Assessment: Report given to RN and Post -op Vital signs reviewed and stable  Post vital signs: Reviewed and stable  Last Vitals:  Vitals Value Taken Time  BP    Temp    Pulse    Resp    SpO2      Last Pain:  Vitals:   03/11/19 1040  TempSrc: Oral  PainSc: 0-No pain      Patients Stated Pain Goal: 8 (83/72/90 2111)  Complications: No apparent anesthesia complications

## 2019-03-11 NOTE — Progress Notes (Signed)
Call to [place] and spoke with Dr. Dennard Schaumann.  He is available to see patient in office at 4:15pm today.   Patient advised if develops shortness of breath or chest pain before her appointment to report to an Emergency Room. Patient agrees.

## 2019-03-11 NOTE — Anesthesia Postprocedure Evaluation (Signed)
Anesthesia Post Note  Patient: Paige Martinez  Procedure(s) Performed: CATARACT EXTRACTION PHACO AND INTRAOCULAR LENS PLACEMENT LEFT EYE (Left Eye)  Patient location during evaluation: PICU Anesthesia Type: MAC Level of consciousness: awake Pain management: pain level controlled Vital Signs Assessment: post-procedure vital signs reviewed and stable Respiratory status: spontaneous breathing Cardiovascular status: stable Anesthetic complications: no Comments: VSS;NSR     Last Vitals:  Vitals:   03/11/19 1040  BP: 126/77  Pulse: (!) 119  Resp: 20  Temp: 36.9 C  SpO2: 96%    Last Pain:  Vitals:   03/11/19 1040  TempSrc: Oral  PainSc: 0-No pain                 Everette Rank

## 2019-03-11 NOTE — Discharge Instructions (Signed)
Please discharge patient when stable, will follow up today with Dr. Aimar Borghi at the [facility] office immediately following discharge.  Leave shield in place until visit.  All paperwork with discharge instructions will be given at the office. ° °

## 2019-03-11 NOTE — Progress Notes (Signed)
Left message for triage nurse at [facility] [phone] to call regarding patient need office appointment today.

## 2019-03-11 NOTE — Anesthesia Preprocedure Evaluation (Signed)
Anesthesia Evaluation  Patient identified by MRN, date of birth, ID band Patient awake    Reviewed: Allergy & Precautions, H&P , NPO status , Patient's Chart, lab work & pertinent test results, reviewed documented beta blocker date and time   Airway        Dental   Pulmonary           Cardiovascular hypertension,      Neuro/Psych negative neurological ROS  negative psych ROS   GI/Hepatic negative GI ROS, Neg liver ROS,   Endo/Other    Renal/GU negative Renal ROS  negative genitourinary   Musculoskeletal   Abdominal   Peds  Hematology negative hematology ROS (+)   Anesthesia Other Findings A pre-op EKG was performed and an incidental finding of atrial fibrillation was noted.  The patient is completely asymptomatic.  Because we are not planning to give any anesthetic, and because the patient is prepped for this very brief and non-invasive procedure, Dr. Marisa Hua and I have agreed to proceed with the cataract extraction and refer the patient immediately to her PCP this afternoon for management of the afib.  The risks and benefits of this approach were discussed with the patient who understands and agrees.  Reproductive/Obstetrics                             Anesthesia Physical Anesthesia Plan  ASA: III  Anesthesia Plan: MAC   Post-op Pain Management:    Induction:   PONV Risk Score and Plan: 2 and TIVA  Airway Management Planned:   Additional Equipment:   Intra-op Plan:   Post-operative Plan:   Informed Consent:   Plan Discussed with: Surgeon and CRNA  Anesthesia Plan Comments:         Anesthesia Quick Evaluation

## 2019-03-11 NOTE — Op Note (Signed)
Date of procedure: 03/11/19  Pre-operative diagnosis: Visually significant age-related cataract, Left Eye (H25.812)  Post-operative diagnosis: Visually significant age-related cataract, Left Eye  Procedure: Removal of cataract via phacoemulsification and insertion of intra-ocular lens Johnson and Nelson  +18.5D into the capsular bag of the Left Eye  Attending surgeon: Gerda Diss. Lucien Budney, MD, MA  Anesthesia: MAC, Topical Akten  Complications: None  Estimated Blood Loss: <69m (minimal)  Specimens: None  Implants: As above  Indications:  Visually significant age-related cataract, Left Eye  Procedure:  The patient was seen and identified in the pre-operative area. The operative eye was identified and dilated.  The operative eye was marked.  Topical anesthesia was administered to the operative eye.     The patient was then to the operative suite and placed in the supine position.  A timeout was performed confirming the patient, procedure to be performed, and all other relevant information.   The patient's face was prepped and draped in the usual fashion for intra-ocular surgery.  A lid speculum was placed into the operative eye and the surgical microscope moved into place and focused.  An inferotemporal paracentesis was created using a 20 gauge paracentesis blade.  Shugarcaine was injected into the anterior chamber.  Viscoelastic was injected into the anterior chamber.  A temporal clear-corneal main wound incision was created using a 2.47mmicrokeratome.  A continuous curvilinear capsulorrhexis was initiated using an irrigating cystitome and completed using capsulorrhexis forceps.  Hydrodissection and hydrodeliniation were performed.  Viscoelastic was injected into the anterior chamber.  A phacoemulsification handpiece and a chopper as a second instrument were used to remove the nucleus and epinucleus. The irrigation/aspiration handpiece was used to remove any remaining cortical  material.   The capsular bag was reinflated with viscoelastic, checked, and found to be intact.  The intraocular lens was inserted into the capsular bag and dialed into place using a Kuglen hook.  The irrigation/aspiration handpiece was used to remove any remaining viscoelastic.  The clear corneal wound and paracentesis wounds were then hydrated and checked with Weck-Cels to be watertight.  The lid-speculum and drape was removed, and the patient's face was cleaned with a wet and dry 4x4.  Maxitrol was instilled in the eye before a clear shield was taped over the eye. The patient was taken to the post-operative care unit in good condition, having tolerated the procedure well.  Post-Op Instructions: [facility] and will receive all other orders and instructions.

## 2019-03-11 NOTE — H&P (Signed)
The H and P was reviewed and updated. The patient was examined.  No changes were found after exam.  The surgical eye was marked.  

## 2019-03-12 ENCOUNTER — Encounter (HOSPITAL_COMMUNITY): Payer: Self-pay | Admitting: Ophthalmology

## 2019-03-12 LAB — TSH: TSH: 2.14 mIU/L (ref 0.40–4.50)

## 2019-03-12 NOTE — Progress Notes (Signed)
Subjective:    Patient ID: Paige Martinez, female    DOB: 1946/03/06, 73 y.o.   MRN: [id number]  HPI We received a call today from anesthesia.  Patient was undergoing cataract surgery today.  During the preoperative.  An EKG was obtained that showed an irregular heartbeat.  There was concern about possible atrial fibrillation so she was referred to my office.  She carries an EKG with her that was obtained while in preop for her surgery.  The official read says accelerated junctional rhythm with premature supraventricular complexes.  Evaluating the rhythm strip V5, there is clearly an irregular rhythm.  However there appears to be P waves prior to every QRS complex.  PR interval is variable.  Upon arrival, the patient had several EKGs performed today at my office.  Patient is clearly in normal sinus rhythm here with frequent PACs.  There is no indication of atrial fibrillation today in the office.  The patient states that over the last month she has noticed several palpitations.  These occur randomly and without provocation.  They would last several seconds and abate spontaneously.  She denies any syncope.  She denies any chest pain.  She denies any shortness of breath or dyspnea on exertion Past Medical History:  Diagnosis Date  . Hypertension   . 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 Vitamin D deficiency 04/18/2008   Past Surgical History:  Procedure Laterality Date  . TONSILLECTOMY    . TUBAL LIGATION     Current Outpatient Medications on File Prior to Visit  Medication Sig Dispense Refill  . Calcium Citrate-Vitamin D (CALCIUM + D PO) Take 1 tablet by mouth daily.     . Cholecalciferol (VITAMIN D) 50 MCG (2000 UT) tablet Take 2,000 Units by mouth daily.    Marland Kitchen ibuprofen (ADVIL) 200 MG tablet Take 200 mg by mouth daily as needed for headache or moderate pain.    Marland Kitchen lisinopril (PRINIVIL,ZESTRIL) 5 MG tablet Take 1 tablet (5 mg total) by mouth daily. (Patient taking differently: Take 5 mg by mouth at  bedtime. ) 90 tablet 3  . Multiple Vitamin (MULTIVITAMIN) tablet Take 1 tablet by mouth daily at 12 noon.      No current facility-administered medications on file prior to visit.    No Known Allergies Social History   Socioeconomic History  . Marital status: Divorced    Spouse name: Not on file  . Number of children: Not on file  . Years of education: Not on file  . Highest education level: Not on file  Occupational History  . Occupation: Probation officer Dept  Social Needs  . Financial resource strain: Not on file  . Food insecurity    Worry: Not on file    Inability: Not on file  . Transportation needs    Medical: Not on file    Non-medical: Not on file  Tobacco Use  . Smoking status: Never Smoker  . Smokeless tobacco: Never Used  Substance and Sexual Activity  . Alcohol use: No  . Drug use: No  . Sexual activity: Not on file  Lifestyle  . Physical activity    Days per week: Not on file    Minutes per session: Not on file  . Stress: Not on file  Relationships  . Social Herbalist on phone: Not on file    Gets together: Not on file    Attends religious service: Not on file    Active member of club or organization:  Not on file    Attends meetings of clubs or organizations: Not on file    Relationship status: Not on file  . Intimate partner violence    Fear of current or ex partner: Not on file    Emotionally abused: Not on file    Physically abused: Not on file    Forced sexual activity: Not on file  Other Topics Concern  . Not on file  Social History Narrative   Divorced.    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   Rides Stationary Bike 4 miles 4 days per week. --20 minutes      Review of Systems  All other systems reviewed and are negative.      Objective:   Physical Exam Vitals signs reviewed.  Constitutional:      Appearance: Normal appearance. She is normal weight.  Cardiovascular:      Rate and Rhythm: Normal rate and regular rhythm.     Heart sounds: Normal heart sounds. No murmur.  Pulmonary:     Effort: Pulmonary effort is normal. No respiratory distress.     Breath sounds: Normal breath sounds. No stridor. No wheezing or rhonchi.  Musculoskeletal:     Right lower leg: No edema.     Left lower leg: No edema.  Neurological:     Mental Status: She is alert.           Assessment & Plan:  The encounter diagnosis was Irregular heart beat. The one EKG that I have from surgery today which was abnormal does not clearly show atrial fibrillation.  There appears to be P waves prior to the QRS complexes.  Given her tachycardia I do not know if the patient was in normal sinus rhythm with frequent PACs that may have given the appearance of atrial fibrillation or if the patient could be experiencing paroxysmal atrial fibrillation.  I am not certain of the diagnosis.  The patient is clearly in normal sinus rhythm today.  Her CHADSVaSC score is 2.  Therefore if she was in atrial fibrillation I would recommend anticoagulation.  Today we discussed the risk and benefits.  I have recommended a cardiology consultation for an event monitor to definitively determine if she is experiencing atrial fibrillation.  While awaiting this I have recommended that she start taking an aspirin 325 mg daily as I believe the risk benefit ratio certainly favors taking the aspirin until a definitive diagnosis can be made.  I recommended she discontinue lisinopril and we will temporarily try Toprol-XL 25 mg daily to see if this helps with the palpitations.

## 2019-03-13 ENCOUNTER — Encounter: Payer: Self-pay | Admitting: Family Medicine

## 2019-03-13 NOTE — Addendum Note (Signed)
Addendum  created 03/13/19 0746 by Ollen Bowl, CRNA   Charge Capture section accepted

## 2019-03-19 ENCOUNTER — Encounter (HOSPITAL_COMMUNITY): Payer: Self-pay

## 2019-03-20 ENCOUNTER — Encounter (HOSPITAL_COMMUNITY)
Admission: RE | Admit: 2019-03-20 | Discharge: 2019-03-20 | Disposition: A | Discharge status: Home / Self Care | Payer: Medicare Other | Source: Ambulatory Visit | Attending: Ophthalmology | Admitting: Ophthalmology

## 2019-03-20 ENCOUNTER — Other Ambulatory Visit: Payer: Self-pay

## 2019-03-20 DIAGNOSIS — Z1159 Encounter for screening for other viral diseases: Secondary | ICD-10-CM | POA: Diagnosis not present

## 2019-03-21 ENCOUNTER — Other Ambulatory Visit (HOSPITAL_COMMUNITY)
Admission: RE | Admit: 2019-03-21 | Discharge: 2019-03-21 | Disposition: A | Discharge status: Home / Self Care | Payer: Medicare Other | Source: Ambulatory Visit | Attending: Ophthalmology | Admitting: Ophthalmology

## 2019-03-21 ENCOUNTER — Other Ambulatory Visit: Payer: Self-pay

## 2019-03-21 DIAGNOSIS — Z1159 Encounter for screening for other viral diseases: Secondary | ICD-10-CM | POA: Diagnosis not present

## 2019-03-21 LAB — SARS CORONAVIRUS 2 (TAT 6-24 HRS): SARS Coronavirus 2: NEGATIVE

## 2019-03-22 ENCOUNTER — Encounter (HOSPITAL_COMMUNITY): Payer: Self-pay | Admitting: Anesthesiology

## 2019-03-25 ENCOUNTER — Encounter (HOSPITAL_COMMUNITY): Payer: Self-pay

## 2019-03-25 ENCOUNTER — Encounter (HOSPITAL_COMMUNITY)
Admission: RE | Disposition: A | Discharge status: Home / Self Care | Payer: Self-pay | Source: Home / Self Care | Attending: Ophthalmology

## 2019-03-25 ENCOUNTER — Ambulatory Visit (HOSPITAL_COMMUNITY)
Admission: RE | Admit: 2019-03-25 | Discharge: 2019-03-25 | Disposition: A | Discharge status: Home / Self Care | Payer: Medicare Other | Attending: Ophthalmology | Admitting: Ophthalmology

## 2019-03-25 ENCOUNTER — Other Ambulatory Visit: Payer: Self-pay

## 2019-03-25 DIAGNOSIS — H269 Unspecified cataract: Secondary | ICD-10-CM | POA: Diagnosis not present

## 2019-03-25 DIAGNOSIS — Z5309 Procedure and treatment not carried out because of other contraindication: Secondary | ICD-10-CM | POA: Insufficient documentation

## 2019-03-25 SURGERY — PHACOEMULSIFICATION, CATARACT, WITH IOL INSERTION
Anesthesia: Monitor Anesthesia Care | Laterality: Right

## 2019-03-25 MED ORDER — LIDOCAINE HCL 3.5 % OP GEL
1.0000 "application " | Freq: Once | OPHTHALMIC | Status: AC
  Administered 2019-03-25: 1 via OPHTHALMIC

## 2019-03-25 MED ORDER — TETRACAINE HCL 0.5 % OP SOLN
1.0000 [drp] | OPHTHALMIC | Status: AC
  Administered 2019-03-25 (×3): 1 [drp] via OPHTHALMIC

## 2019-03-25 MED ORDER — PHENYLEPHRINE HCL 2.5 % OP SOLN
1.0000 [drp] | OPHTHALMIC | Status: AC
  Administered 2019-03-25 (×3): 1 [drp] via OPHTHALMIC

## 2019-03-25 MED ORDER — CYCLOPENTOLATE-PHENYLEPHRINE 0.2-1 % OP SOLN
1.0000 [drp] | OPHTHALMIC | Status: AC
  Administered 2019-03-25 (×3): 1 [drp] via OPHTHALMIC

## 2019-03-25 NOTE — Progress Notes (Signed)
Patient with HR in the 140's at 0940.  At 0942, HR was in 60's.  HR was regular then would run A-Fib/Flutter.  Dr Hilaria Ota notified.  Patient was prescribed Toprol from PCP but has not been taking because the bottle states "may cause dizziness".  Wants to see cardiologist before she begins taking the medication.  Dr. Hilaria Ota canceled procedure.

## 2019-03-26 ENCOUNTER — Telehealth: Payer: Self-pay | Admitting: *Deleted

## 2019-03-26 NOTE — Telephone Encounter (Signed)
Called patient. She is aware she will need COVID-19 testing 2 days prior to procedure and will need to quarantine at home after testing until procedure. Patient scheduled for 8/31 at 11:00am. Patient aware of testing location

## 2019-04-24 ENCOUNTER — Other Ambulatory Visit: Payer: Self-pay

## 2019-04-24 ENCOUNTER — Ambulatory Visit (INDEPENDENT_AMBULATORY_CARE_PROVIDER_SITE_OTHER): Payer: Medicare Other

## 2019-04-24 ENCOUNTER — Encounter: Payer: Self-pay | Admitting: Cardiovascular Disease

## 2019-04-24 ENCOUNTER — Ambulatory Visit (INDEPENDENT_AMBULATORY_CARE_PROVIDER_SITE_OTHER): Payer: Medicare Other | Admitting: Cardiovascular Disease

## 2019-04-24 VITALS — BP 120/71 | HR 71 | Temp 97.7°F | Ht 70.0 in | Wt 195.0 lb

## 2019-04-24 DIAGNOSIS — Z7189 Other specified counseling: Secondary | ICD-10-CM | POA: Diagnosis not present

## 2019-04-24 DIAGNOSIS — I4891 Unspecified atrial fibrillation: Secondary | ICD-10-CM

## 2019-04-24 DIAGNOSIS — I4892 Unspecified atrial flutter: Secondary | ICD-10-CM | POA: Diagnosis not present

## 2019-04-24 DIAGNOSIS — I10 Essential (primary) hypertension: Secondary | ICD-10-CM

## 2019-04-24 MED ORDER — APIXABAN 5 MG PO TABS: 5.0000 mg | ORAL_TABLET | Freq: Two times a day (BID) | ORAL | 3 refills | Status: DC

## 2019-04-24 MED ORDER — METOPROLOL TARTRATE 25 MG PO TABS: 25.0000 mg | ORAL_TABLET | Freq: Two times a day (BID) | ORAL | 3 refills | Status: DC

## 2019-04-24 NOTE — Patient Instructions (Signed)
Medication Instructions:  STOP ASPIRIN STOP LISINOPRIL STOP METOPROLOL XL    START ELIQUIS 5 MG - TWO TIMES DAILY START LOPRESSOR 25 MG - TWO TIMES DAILY   Labwork: NONE  Testing/Procedures: Your physician has requested that you have an echocardiogram. Echocardiography is a painless test that uses sound waves to create images of your heart. It provides your doctor with information about the size and shape of your heart and how well your heart's chambers and valves are working. This procedure takes approximately one hour. There are no restrictions for this procedure.  Your physician has recommended that you wear an event monitor. Event monitors are medical devices that record the heart's electrical activity. Doctors most often [place] these monitors to diagnose arrhythmias. Arrhythmias are problems with the speed or rhythm of the heartbeat. The monitor is a small, portable device. You can wear one while you do your normal daily activities. This is usually used to diagnose what is causing palpitations/syncope (passing out). - 30 DAYS-    Follow-Up: Your physician recommends that you schedule a follow-up appointment in: 2 MONTHS    Any Other Special Instructions Will Be Listed Below (If Applicable).     If you need a refill on your cardiac medications before your next appointment, please call your pharmacy.

## 2019-04-24 NOTE — Progress Notes (Addendum)
CARDIOLOGY CONSULT NOTE  Patient ID: Paige Martinez MRN: [id number] DOB/AGE: [birth year] 73 y.o.  Admit date: (Not on file) Primary Physician: Susy Frizzle, MD  Reason for Consultation: Arrhythmia  HPI: Paige Martinez is a 73 y.o. female who is being seen today for the evaluation of arrhythmia at the request of Susy Frizzle, MD.   I reviewed notes from her PCP.  She underwent left cataract surgery on 03/11/2019 and an ECG was obtained which apparently showed some arrhythmia.  There was concern about atrial fibrillation so she was referred to her PCP.  At her PCPs office she appeared to be in sinus rhythm with frequent PACs.  She has been experiencing palpitations for the past month.  She denies any associated chest pain or syncope.  She denies shortness of breath.  I personally reviewed the ECG performed on 03/11/2019 at 1050 which was reported as "accelerated junctional rhythm with PACs ".  Upon review of it it appears to demonstrate atrial fibrillation.  I read the ECG on 03/11/2019 performed at 1630 which showed sinus rhythm with an isolated PAC.  She wants to get right eye cataract surgery but the physician refused to do it until her arrhythmia was addressed.  She told me that she has not taken metoprolol succinate after she read the side effects which include dizziness.  She said she already has a lot of dizziness.  Sometimes it appears the room is spinning.  She denies exertional chest pain and shortness of breath.  She denies orthopnea, leg swelling, paroxysmal nocturnal dyspnea.  She said her ankles sometimes hurt.  She checks her blood pressure and heart rate routinely at home.  Her heart rate has on 1 instance gotten as high as 140 bpm but it is usually in the 80 bpm range.  When she has had palpitations she checks her blood pressure and takes her pulse.  On rare occasions her blood pressure dropped to the 85/58 range.  I started my physical examination and her  heart rate was fast and irregular. I then ordered an ECG.  This demonstrated rapid atrial flutter, 135 bpm, with variable conduction.  Social history: She has a son and daughter-in-law as well as grandchild and great grandchild who lives locally.  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  No Known Allergies  Current Outpatient Medications  Medication Sig Dispense Refill  . aspirin EC 81 MG tablet Take 81 mg by mouth daily.    . Calcium Citrate-Vitamin D (CALCIUM + D PO) Take 1 tablet by mouth daily.     . Cholecalciferol (VITAMIN D) 50 MCG (2000 UT) tablet Take 2,000 Units by mouth daily.    Marland Kitchen ibuprofen (ADVIL) 200 MG tablet Take 200 mg by mouth daily as needed for headache or moderate pain.    Marland Kitchen lisinopril (PRINIVIL,ZESTRIL) 5 MG tablet Take 1 tablet (5 mg total) by mouth daily. (Patient taking differently: Take 10 mg by mouth at bedtime. ) 90 tablet 3  . Multiple Vitamin (MULTIVITAMIN) tablet Take 1 tablet by mouth daily at 12 noon.     . metoprolol succinate (TOPROL-XL) 25 MG 24 hr tablet Take 1 tablet (25 mg total) by mouth daily. (Patient not taking: Reported on 04/24/2019) 90 tablet 3   No current facility-administered medications for this visit.     Past Medical History:  Diagnosis Date  . Hypertension   .  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 Vitamin D deficiency 04/18/2008    Past Surgical History:  Procedure Laterality Date  . CATARACT EXTRACTION W/PHACO Left 03/11/2019   Procedure: CATARACT EXTRACTION PHACO AND INTRAOCULAR LENS PLACEMENT LEFT EYE;  Surgeon: Baruch Goldmann, MD;  Location: [facility];  Service: Ophthalmology;  Laterality: Left;  CDE: 11.60  . TONSILLECTOMY    . TUBAL LIGATION      Social History   Socioeconomic History  . Marital status: Divorced    Spouse name: Not on file  . Number of children: Not on file  . Years of education: Not on file  . Highest education level: Not on file  Occupational  History  . Occupation: Probation officer Dept  Social Needs  . Financial resource strain: Not on file  . Food insecurity    Worry: Not on file    Inability: Not on file  . Transportation needs    Medical: Not on file    Non-medical: Not on file  Tobacco Use  . Smoking status: Never Smoker  . Smokeless tobacco: Never Used  Substance and Sexual Activity  . Alcohol use: No  . Drug use: No  . Sexual activity: Not on file  Lifestyle  . Physical activity    Days per week: Not on file    Minutes per session: Not on file  . Stress: Not on file  Relationships  . Social Herbalist on phone: Not on file    Gets together: Not on file    Attends religious service: Not on file    Active member of club or organization: Not on file    Attends meetings of clubs or organizations: Not on file    Relationship status: Not on file  . Intimate partner violence    Fear of current or ex partner: Not on file    Emotionally abused: Not on file    Physically abused: Not on file    Forced sexual activity: Not on file  Other Topics Concern  . Not on file  Social History Narrative   Divorced.    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   Rides Stationary Bike 4 miles 4 days per week. --20 minutes     No family history of premature CAD in 1st degree relatives.  Current Meds  Medication Sig  . aspirin EC 81 MG tablet Take 81 mg by mouth daily.  . Calcium Citrate-Vitamin D (CALCIUM + D PO) Take 1 tablet by mouth daily.   . Cholecalciferol (VITAMIN D) 50 MCG (2000 UT) tablet Take 2,000 Units by mouth daily.  Marland Kitchen ibuprofen (ADVIL) 200 MG tablet Take 200 mg by mouth daily as needed for headache or moderate pain.  Marland Kitchen lisinopril (PRINIVIL,ZESTRIL) 5 MG tablet Take 1 tablet (5 mg total) by mouth daily. (Patient taking differently: Take 10 mg by mouth at bedtime. )  . Multiple Vitamin (MULTIVITAMIN) tablet Take 1 tablet by mouth daily at 12 noon.        Review of systems complete and found to be negative unless listed above in HPI    Physical exam Blood pressure 120/71, pulse 71, temperature 97.7 F (36.5 C), height 5\' 10"  (1.778 m), weight 195 lb (88.5 kg). General: NAD Neck: No JVD, no thyromegaly or thyroid nodule.  Lungs: Clear to auscultation bilaterally with normal respiratory effort. CV: Nondisplaced PMI.  Tachycardic, irregular, normal S1/S2, no S3, no murmur.  No  peripheral edema.  No carotid bruit.    Abdomen: Soft, nontender, no distention.  Skin: Intact without lesions or rashes.  Neurologic: Alert and oriented x 3.  Psych: Normal affect. Extremities: No clubbing or cyanosis.  HEENT: Normal.   ECG: Most recent ECG reviewed.   Labs: Lab Results  Component Value Date/Time   K 4.9 07/12/2018 09:30 AM   BUN 16 07/12/2018 09:30 AM   CREATININE 0.77 07/12/2018 09:30 AM   ALT 15 07/12/2018 09:30 AM   TSH 2.14 03/11/2019 04:51 PM   HGB 14.6 07/12/2018 09:30 AM     Lipids: Lab Results  Component Value Date/Time   LDLCALC 117 (H) 07/12/2018 09:30 AM   CHOL 197 07/12/2018 09:30 AM   TRIG 104 07/12/2018 09:30 AM   HDL 59 07/12/2018 09:30 AM        ASSESSMENT AND PLAN:   1.  Arrhythmia/palpitations/atrial fibrillation and flutter: ECG performed in our office today shows rapid atrial flutter with variable conduction, 135 bpm.  While she was prescribed Toprol-XL she has not taken it as she was worried about the side effects of dizziness.  ECGs from July 2020 reviewed above.  There may have been a transient episode of rapid atrial fibrillation at that time.  She has been experiencing palpitations for the past month.   I will stop lisinopril and start Lopressor 25 mg twice daily. CHADSVASC score is 3.  I will stop aspirin and start apixaban 5 mg twice daily. We talked about other treatment modalities including the possibility of ablation. I will obtain a 30-day event monitor. I will order a 2-D echocardiogram with  Doppler to evaluate cardiac structure, function, and regional wall motion.  2.  Hypertension: Controlled on present therapy.  I will stop lisinopril as I am starting Lopressor 25 mg twice daily.   Disposition: Follow up in 6 weeks  Signed: Kate Sable, M.D., F.A.C.C.  04/24/2019, 2:18 PM   ADDENDUM: The event monitor under cardiac procedures is not for Paige Martinez but for a different patient, Paige Martinez.

## 2019-04-25 ENCOUNTER — Ambulatory Visit (HOSPITAL_COMMUNITY)
Admission: RE | Admit: 2019-04-25 | Discharge: 2019-04-25 | Disposition: A | Discharge status: Home / Self Care | Payer: Medicare Other | Source: Ambulatory Visit | Attending: Cardiovascular Disease | Admitting: Cardiovascular Disease

## 2019-04-25 DIAGNOSIS — I4892 Unspecified atrial flutter: Secondary | ICD-10-CM | POA: Insufficient documentation

## 2019-04-25 NOTE — Progress Notes (Signed)
*  PRELIMINARY RESULTS* Echocardiogram 2D Echocardiogram has been performed.  Paige Martinez 04/25/2019, 9:13 AM

## 2019-04-26 ENCOUNTER — Telehealth: Payer: Self-pay | Admitting: General Practice

## 2019-04-26 NOTE — Telephone Encounter (Signed)
Called endo and procedure cancelled. °

## 2019-04-26 NOTE — Telephone Encounter (Signed)
The patient called and would like to cancel her tcs for now.  Her pcp has her on some new medication and it makes her feel bad.  She will call back to reschedule.

## 2019-04-29 ENCOUNTER — Other Ambulatory Visit (HOSPITAL_COMMUNITY)
Admission: RE | Admit: 2019-04-29 | Discharge: 2019-04-29 | Disposition: A | Discharge status: Home / Self Care | Payer: Medicare Other | Source: Ambulatory Visit | Attending: Internal Medicine | Admitting: Internal Medicine

## 2019-04-29 ENCOUNTER — Other Ambulatory Visit: Payer: Self-pay

## 2019-05-01 ENCOUNTER — Ambulatory Visit (HOSPITAL_COMMUNITY): Admission: RE | Admit: 2019-05-01 | Payer: Medicare Other | Source: Home / Self Care | Admitting: Internal Medicine

## 2019-05-01 ENCOUNTER — Encounter (HOSPITAL_COMMUNITY): Admission: RE | Payer: Self-pay | Source: Home / Self Care

## 2019-05-01 SURGERY — COLONOSCOPY
Anesthesia: Moderate Sedation

## 2019-05-07 ENCOUNTER — Telehealth: Payer: Self-pay | Admitting: Cardiovascular Disease

## 2019-05-07 MED ORDER — APIXABAN 5 MG PO TABS: 5.0000 mg | ORAL_TABLET | Freq: Two times a day (BID) | ORAL | 3 refills | Status: DC

## 2019-05-07 NOTE — Telephone Encounter (Signed)
RX sent

## 2019-05-07 NOTE — Telephone Encounter (Signed)
Patient states she was given samples of Eliquis at end of August. If she needs to continue taking this medication she needs RX sent in to pharmacy. / tg

## 2019-05-16 ENCOUNTER — Telehealth: Payer: Self-pay | Admitting: Internal Medicine

## 2019-05-16 NOTE — Telephone Encounter (Signed)
Let me reach out to Dr. Bronson Ing as there is nothing I found in his note approving colonoscopy yet.

## 2019-05-16 NOTE — Telephone Encounter (Signed)
Pt was calling to reschedule her procedure. She had one scheduled for 9/2 and cancelled. Please call [phone]

## 2019-05-16 NOTE — Telephone Encounter (Signed)
Called pt, since last OV w/LSL she took 2 weeks of Eliquis samples then stopped d/t her cost was going to be $600/month. She restarted Aspirin 81mg  BID. She has also started Metoprolol 25mg  BID. Currently wearing heart monitor and will be wearing x1 more week. Next cardiology appt with Dr. Jacinta Shoe 06/25/19. States she had asked cardiologist about having TCS and he said it would be ok. Routing to LSL to advise about TCS w/RMR.

## 2019-05-20 ENCOUNTER — Telehealth: Payer: Self-pay

## 2019-05-20 NOTE — Telephone Encounter (Signed)
-----   Message from Malen Gauze, RN sent at 05/20/2019  9:39 AM EDT ----- Regarding: FW: Pt stopped Eliquis on her own due to cost [place], If patient doesn't qualify set her up with me for warfarin.   Thanks, Lattie Haw ----- Message ----- From: Herminio Commons, MD Sent: 05/16/2019   4:41 PM EDT To: Malen Gauze, RN, Bernita Raisin, RN Subject: Pt stopped Eliquis on her own due to cost      [place], Can we see if she can get patient assistance? If not, she'll need warfarin. ----- Message ----- From: Westly Pam Sent: 05/16/2019   4:27 PM EDT To: Herminio Commons, MD, Mahala Menghini, PA-C  Hi!  Patient called [place] to reschedule a colonoscopy (h/o positive Cologuard test) that she missed due to heart palpitations.  She is currently undergoing work-up with you guys, heart monitor pending but told us you approved [place] proceeding with colonoscopy.  Just wanted to verify this information. Would you like [place] to wait until after her next office visit with you to pursue scheduling a colonoscopy?  Also wanted to let you know that she advised [place] that she cannot afford Eliquis so she stopped taking it and started aspirin 81 mg twice daily on her own.  Thanks in advance for any input!  Magda Paganini

## 2019-05-20 NOTE — Telephone Encounter (Signed)
Pt will stop in office 05/21/2019 to complete patient assistance form for eliquis

## 2019-05-20 NOTE — Telephone Encounter (Signed)
Dr. Bronson Ing advised patient could proceed with TCS. She is currently trying to get patient assistance for Eliquis. If not approved, she will be started on Coumadin.   We can save a date for TCS with RMR. BUT she needs OV prior to procedure (at least two weeks before so that we can provide instructions regarding anticoagulation.

## 2019-05-20 NOTE — Telephone Encounter (Signed)
Called and informed pt. Holding spot for TCS 07/09/19. OV scheduled for 06/24/19 at 10:30am.

## 2019-05-23 DIAGNOSIS — I4892 Unspecified atrial flutter: Secondary | ICD-10-CM

## 2019-05-29 ENCOUNTER — Other Ambulatory Visit: Payer: Self-pay

## 2019-05-31 DIAGNOSIS — Z23 Encounter for immunization: Secondary | ICD-10-CM | POA: Diagnosis not present

## 2019-06-24 ENCOUNTER — Other Ambulatory Visit: Payer: Self-pay

## 2019-06-24 ENCOUNTER — Encounter: Payer: Self-pay | Admitting: Gastroenterology

## 2019-06-24 ENCOUNTER — Ambulatory Visit (INDEPENDENT_AMBULATORY_CARE_PROVIDER_SITE_OTHER): Payer: Medicare Other | Admitting: Gastroenterology

## 2019-06-24 VITALS — BP 147/75 | HR 50 | Temp 96.9°F | Ht 70.0 in | Wt 198.6 lb

## 2019-06-24 DIAGNOSIS — R195 Other fecal abnormalities: Secondary | ICD-10-CM | POA: Diagnosis not present

## 2019-06-24 NOTE — Progress Notes (Signed)
Primary Care Physician:  Susy Frizzle, MD  Primary Gastroenterologist:  Garfield Cornea, MD   Chief Complaint  Patient presents with  . + cologuard    Holding place on schedule for 11/10.    HPI:  Paige Martinez is a 73 y.o. female here for follow up to reschedule colonoscopy which is being done for positive Cologuard.  Last colonoscopy over 10 years ago through [facility].  Previously declined colonoscopy worried about bowel prep.  Does not do good with Dulcolax.  Typically has 2-3 soft stools daily.  No melena or rectal bleeding.  Denies any abdominal pain.  No upper GI symptoms.  Maternal grandmother had colon cancer at age greater than 109.  Patient was noted to have an abnormal EKG prior to cataract surgery in July.  She saw cardiology.  During evaluation she had a EKG which demonstrated rapid atrial flutter with rate of 135.  Possibly with transient episode of rapid A. fib back in July during EKG with preop.  Patient has not been able to afford Eliquis.  She states she has an appointment tomorrow with cardiologist to discuss options.  Current Outpatient Medications  Medication Sig Dispense Refill  . aspirin EC 81 MG tablet Take 81 mg by mouth daily.    . Calcium Citrate-Vitamin D (CALCIUM + D PO) Take 1 tablet by mouth daily.     . Cholecalciferol (VITAMIN D) 50 MCG (2000 UT) tablet Take 2,000 Units by mouth daily.    Marland Kitchen ibuprofen (ADVIL) 200 MG tablet Take 200 mg by mouth daily as needed for headache or moderate pain.    . metoprolol tartrate (LOPRESSOR) 25 MG tablet Take 1 tablet (25 mg total) by mouth 2 (two) times daily. 180 tablet 3  . Multiple Vitamin (MULTIVITAMIN) tablet Take 1 tablet by mouth daily at 12 noon.      No current facility-administered medications for this visit.     Allergies as of 06/24/2019  . (No Known Allergies)    Past Medical History:  Diagnosis Date  . Atrial flutter ([place])   . Hypertension   . 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 Vitamin D deficiency  04/18/2008    Past Surgical History:  Procedure Laterality Date  . CATARACT EXTRACTION W/PHACO Left 03/11/2019   Procedure: CATARACT EXTRACTION PHACO AND INTRAOCULAR LENS PLACEMENT LEFT EYE;  Surgeon: Baruch Goldmann, MD;  Location: [facility];  Service: Ophthalmology;  Laterality: Left;  CDE: 11.60  . TONSILLECTOMY    . TUBAL LIGATION      Family History  Problem Relation Age of Onset  . Alcohol abuse Father   . Cancer Father 7       Pancreatic Cancer  . Hypertension Sister   . Hypertension Brother   . Colon cancer Paternal Grandmother        greater than 53 years old    Social History   Socioeconomic History  . Marital status: Divorced    Spouse name: Not on file  . Number of children: Not on file  . Years of education: Not on file  . Highest education level: Not on file  Occupational History  . Occupation: Probation officer Dept  Social Needs  . Financial resource strain: Not on file  . Food insecurity    Worry: Not on file    Inability: Not on file  . Transportation needs    Medical: Not on file    Non-medical: Not on file  Tobacco Use  . Smoking status: Never Smoker  . Smokeless  tobacco: Never Used  Substance and Sexual Activity  . Alcohol use: No  . Drug use: No  . Sexual activity: Not on file  Lifestyle  . Physical activity    Days per week: Not on file    Minutes per session: Not on file  . Stress: Not on file  Relationships  . Social Herbalist on phone: Not on file    Gets together: Not on file    Attends religious service: Not on file    Active member of club or organization: Not on file    Attends meetings of clubs or organizations: Not on file    Relationship status: Not on file  . Intimate partner violence    Fear of current or ex partner: Not on file    Emotionally abused: Not on file    Physically abused: Not on file    Forced sexual activity: Not on file  Other Topics Concern  . Not on file  Social History Narrative   Divorced.    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   Rides Stationary Bike 4 miles 4 days per week. --20 minutes      ROS:  General: Negative for anorexia, weight loss, fever, chills, fatigue, weakness. Eyes: Negative for vision changes.  ENT: Negative for hoarseness, difficulty swallowing , nasal congestion. CV: Negative for chest pain, angina, palpitations, dyspnea on exertion, peripheral edema.  Respiratory: Negative for dyspnea at rest, dyspnea on exertion, cough, sputum, wheezing.  GI: See history of present illness. GU:  Negative for dysuria, hematuria, urinary incontinence, urinary frequency, nocturnal urination.  MS: Negative for joint pain, low back pain.  Derm: Negative for rash or itching.  Neuro: Negative for weakness, abnormal sensation, seizure, frequent headaches, memory loss, confusion.  Psych: Negative for anxiety, depression, suicidal ideation, hallucinations.  Endo: Negative for unusual weight change.  Heme: Negative for bruising or bleeding. Allergy: Negative for rash or hives.    Physical Examination:  BP (!) 147/75   Pulse (!) 50   Temp (!) 96.9 F (36.1 C) (Oral)   Ht 5\' 10"  (1.778 m)   Wt 198 lb 9.6 oz (90.1 kg)   BMI 28.50 kg/m    General: Well-nourished, well-developed in no acute distress.  Head: Normocephalic, atraumatic.   Eyes: Conjunctiva pink, no icterus. Mouth: Oropharyngeal mucosa moist and pink , no lesions erythema or exudate. Neck: Supple without thyromegaly, masses, or lymphadenopathy.  Lungs: Clear to auscultation bilaterally.  Heart: Regular rate and rhythm, no murmurs rubs or gallops.  Abdomen: Bowel sounds are normal, nontender, nondistended, no hepatosplenomegaly or masses, no abdominal bruits or    hernia , no rebound or guarding.   Rectal: Not performed Extremities: No lower extremity edema. No clubbing or deformities.  Neuro: Alert and oriented x 4 , grossly normal neurologically.  Skin: Warm  and dry, no rash or jaundice.   Psych: Alert and cooperative, normal mood and affect.  Labs: Lab Results  Component Value Date   CREATININE 0.77 07/12/2018   BUN 16 07/12/2018   NA 143 07/12/2018   K 4.9 07/12/2018   CL 106 07/12/2018   CO2 28 07/12/2018   Lab Results  Component Value Date   WBC 10.0 07/12/2018   HGB 14.6 07/12/2018   HCT 42.6 07/12/2018   MCV 85.7 07/12/2018   PLT 323 07/12/2018   Lab Results  Component Value Date   ALT  15 07/12/2018   AST 16 07/12/2018   ALKPHOS 114 06/16/2016   BILITOT 0.4 07/12/2018   Lab Results  Component Value Date   TSH 2.14 03/11/2019     Imaging Studies: No results found.

## 2019-06-24 NOTE — H&P (View-Only) (Signed)
Primary Care Physician:  Susy Frizzle, MD  Primary Gastroenterologist:  Garfield Cornea, MD   Chief Complaint  Patient presents with  . + cologuard    Holding place on schedule for 11/10.    HPI:  Paige Martinez is a 73 y.o. female here for follow up to reschedule colonoscopy which is being done for positive Cologuard.  Last colonoscopy over 10 years ago through [facility].  Previously declined colonoscopy worried about bowel prep.  Does not do good with Dulcolax.  Typically has 2-3 soft stools daily.  No melena or rectal bleeding.  Denies any abdominal pain.  No upper GI symptoms.  Maternal grandmother had colon cancer at age greater than 50.  Patient was noted to have an abnormal EKG prior to cataract surgery in July.  She saw cardiology.  During evaluation she had a EKG which demonstrated rapid atrial flutter with rate of 135.  Possibly with transient episode of rapid A. fib back in July during EKG with preop.  Patient has not been able to afford Eliquis.  She states she has an appointment tomorrow with cardiologist to discuss options.  Current Outpatient Medications  Medication Sig Dispense Refill  . aspirin EC 81 MG tablet Take 81 mg by mouth daily.    . Calcium Citrate-Vitamin D (CALCIUM + D PO) Take 1 tablet by mouth daily.     . Cholecalciferol (VITAMIN D) 50 MCG (2000 UT) tablet Take 2,000 Units by mouth daily.    Marland Kitchen ibuprofen (ADVIL) 200 MG tablet Take 200 mg by mouth daily as needed for headache or moderate pain.    . metoprolol tartrate (LOPRESSOR) 25 MG tablet Take 1 tablet (25 mg total) by mouth 2 (two) times daily. 180 tablet 3  . Multiple Vitamin (MULTIVITAMIN) tablet Take 1 tablet by mouth daily at 12 noon.      No current facility-administered medications for this visit.     Allergies as of 06/24/2019  . (No Known Allergies)    Past Medical History:  Diagnosis Date  . Atrial flutter ([place])   . Hypertension   . 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 Vitamin D deficiency  04/18/2008    Past Surgical History:  Procedure Laterality Date  . CATARACT EXTRACTION W/PHACO Left 03/11/2019   Procedure: CATARACT EXTRACTION PHACO AND INTRAOCULAR LENS PLACEMENT LEFT EYE;  Surgeon: Baruch Goldmann, MD;  Location: [facility];  Service: Ophthalmology;  Laterality: Left;  CDE: 11.60  . TONSILLECTOMY    . TUBAL LIGATION      Family History  Problem Relation Age of Onset  . Alcohol abuse Father   . Cancer Father 73       Pancreatic Cancer  . Hypertension Sister   . Hypertension Brother   . Colon cancer Paternal Grandmother        greater than 13 years old    Social History   Socioeconomic History  . Marital status: Divorced    Spouse name: Not on file  . Number of children: Not on file  . Years of education: Not on file  . Highest education level: Not on file  Occupational History  . Occupation: Probation officer Dept  Social Needs  . Financial resource strain: Not on file  . Food insecurity    Worry: Not on file    Inability: Not on file  . Transportation needs    Medical: Not on file    Non-medical: Not on file  Tobacco Use  . Smoking status: Never Smoker  . Smokeless  tobacco: Never Used  Substance and Sexual Activity  . Alcohol use: No  . Drug use: No  . Sexual activity: Not on file  Lifestyle  . Physical activity    Days per week: Not on file    Minutes per session: Not on file  . Stress: Not on file  Relationships  . Social Herbalist on phone: Not on file    Gets together: Not on file    Attends religious service: Not on file    Active member of club or organization: Not on file    Attends meetings of clubs or organizations: Not on file    Relationship status: Not on file  . Intimate partner violence    Fear of current or ex partner: Not on file    Emotionally abused: Not on file    Physically abused: Not on file    Forced sexual activity: Not on file  Other Topics Concern  . Not on file  Social History Narrative   Divorced.    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   Rides Stationary Bike 4 miles 4 days per week. --20 minutes      ROS:  General: Negative for anorexia, weight loss, fever, chills, fatigue, weakness. Eyes: Negative for vision changes.  ENT: Negative for hoarseness, difficulty swallowing , nasal congestion. CV: Negative for chest pain, angina, palpitations, dyspnea on exertion, peripheral edema.  Respiratory: Negative for dyspnea at rest, dyspnea on exertion, cough, sputum, wheezing.  GI: See history of present illness. GU:  Negative for dysuria, hematuria, urinary incontinence, urinary frequency, nocturnal urination.  MS: Negative for joint pain, low back pain.  Derm: Negative for rash or itching.  Neuro: Negative for weakness, abnormal sensation, seizure, frequent headaches, memory loss, confusion.  Psych: Negative for anxiety, depression, suicidal ideation, hallucinations.  Endo: Negative for unusual weight change.  Heme: Negative for bruising or bleeding. Allergy: Negative for rash or hives.    Physical Examination:  BP (!) 147/75   Pulse (!) 50   Temp (!) 96.9 F (36.1 C) (Oral)   Ht 5\' 10"  (1.778 m)   Wt 198 lb 9.6 oz (90.1 kg)   BMI 28.50 kg/m    General: Well-nourished, well-developed in no acute distress.  Head: Normocephalic, atraumatic.   Eyes: Conjunctiva pink, no icterus. Mouth: Oropharyngeal mucosa moist and pink , no lesions erythema or exudate. Neck: Supple without thyromegaly, masses, or lymphadenopathy.  Lungs: Clear to auscultation bilaterally.  Heart: Regular rate and rhythm, no murmurs rubs or gallops.  Abdomen: Bowel sounds are normal, nontender, nondistended, no hepatosplenomegaly or masses, no abdominal bruits or    hernia , no rebound or guarding.   Rectal: Not performed Extremities: No lower extremity edema. No clubbing or deformities.  Neuro: Alert and oriented x 4 , grossly normal neurologically.  Skin: Warm  and dry, no rash or jaundice.   Psych: Alert and cooperative, normal mood and affect.  Labs: Lab Results  Component Value Date   CREATININE 0.77 07/12/2018   BUN 16 07/12/2018   NA 143 07/12/2018   K 4.9 07/12/2018   CL 106 07/12/2018   CO2 28 07/12/2018   Lab Results  Component Value Date   WBC 10.0 07/12/2018   HGB 14.6 07/12/2018   HCT 42.6 07/12/2018   MCV 85.7 07/12/2018   PLT 323 07/12/2018   Lab Results  Component Value Date   ALT  15 07/12/2018   AST 16 07/12/2018   ALKPHOS 114 06/16/2016   BILITOT 0.4 07/12/2018   Lab Results  Component Value Date   TSH 2.14 03/11/2019     Imaging Studies: No results found.

## 2019-06-24 NOTE — Assessment & Plan Note (Signed)
History of positive Cologuard.  Colonoscopy was canceled due to acute onset arrhythmia, possible transient A. fib and then noted A flutter with rapid response.  Patient sees cardiology tomorrow.  She did not start Eliquis due to expense.  She is not sure that she wants to take Coumadin. She will discuss with cardiology tomorrow.   She would like to pursue colonoscopy at this time, currently scheduled for 11/10.  Patient was advised that if she begins anticoagulant between now and her procedure she will need to let us know so that we can provide instructions regarding stopping the anticoagulant.  I have discussed the risks, alternatives, benefits with regards to but not limited to the risk of reaction to medication, bleeding, infection, perforation and the patient is agreeable to proceed. Written consent to be obtained.

## 2019-06-24 NOTE — Progress Notes (Signed)
cc'ed to pcp °

## 2019-06-24 NOTE — Patient Instructions (Signed)
Colonoscopy as scheduled. See separate instructions.  

## 2019-06-25 ENCOUNTER — Other Ambulatory Visit: Payer: Self-pay

## 2019-06-25 ENCOUNTER — Ambulatory Visit: Payer: Medicare Other

## 2019-06-25 ENCOUNTER — Encounter: Payer: Self-pay | Admitting: Cardiovascular Disease

## 2019-06-25 ENCOUNTER — Ambulatory Visit (INDEPENDENT_AMBULATORY_CARE_PROVIDER_SITE_OTHER): Payer: Medicare Other | Admitting: Cardiovascular Disease

## 2019-06-25 VITALS — BP 130/65 | HR 54 | Temp 96.9°F | Ht 70.0 in | Wt 198.0 lb

## 2019-06-25 DIAGNOSIS — I10 Essential (primary) hypertension: Secondary | ICD-10-CM | POA: Diagnosis not present

## 2019-06-25 DIAGNOSIS — I4892 Unspecified atrial flutter: Secondary | ICD-10-CM | POA: Diagnosis not present

## 2019-06-25 DIAGNOSIS — I482 Chronic atrial fibrillation, unspecified: Secondary | ICD-10-CM

## 2019-06-25 DIAGNOSIS — I4891 Unspecified atrial fibrillation: Secondary | ICD-10-CM | POA: Diagnosis not present

## 2019-06-25 DIAGNOSIS — Z7189 Other specified counseling: Secondary | ICD-10-CM | POA: Diagnosis not present

## 2019-06-25 MED ORDER — METOPROLOL TARTRATE 50 MG PO TABS: 50.0000 mg | ORAL_TABLET | Freq: Two times a day (BID) | ORAL | 3 refills | Status: DC

## 2019-06-25 NOTE — Progress Notes (Signed)
SUBJECTIVE: Patient presents for follow-up of atrial fibrillation and flutter.  At her initial visit with me on 04/24/2019, I started Lopressor 25 mg twice daily and apixaban 5 mg twice daily.  Event monitoring demonstrated sinus rhythm with atrial fibrillation, atrial flutter, PACs, and PVCs.  Echocardiogram on 04/25/2019 demonstrated normal LV systolic function, EF 55 to 60%, mild LVH, and grade 1 diastolic dysfunction.  She denies chest pain, leg swelling, orthopnea, dizziness, and paroxysmal nocturnal dyspnea.  She does experience episodic palpitations and can feel when her heart is racing.  Eliquis would cost her $600 so she finished the samples we provided her with and started taking aspirin 81 mg daily.  We talked about warfarin but she does not want to be on this.    Review of Systems: As per "subjective", otherwise negative.  No Known Allergies  Current Outpatient Medications  Medication Sig Dispense Refill  . aspirin EC 81 MG tablet Take 81 mg by mouth 2 (two) times daily.     . Calcium Citrate-Vitamin D (CALCIUM + D PO) Take 1 tablet by mouth daily.     . Cholecalciferol (VITAMIN D) 50 MCG (2000 UT) tablet Take 2,000 Units by mouth daily.    Marland Kitchen ibuprofen (ADVIL) 200 MG tablet Take 200 mg by mouth daily as needed for headache or moderate pain.    . metoprolol tartrate (LOPRESSOR) 25 MG tablet Take 1 tablet (25 mg total) by mouth 2 (two) times daily. 180 tablet 3  . Multiple Vitamin (MULTIVITAMIN) tablet Take 1 tablet by mouth daily at 12 noon.      No current facility-administered medications for this visit.     Past Medical History:  Diagnosis Date  . Atrial flutter ([place])   . Hypertension   . 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 Vitamin D deficiency 04/18/2008    Past Surgical History:  Procedure Laterality Date  . CATARACT EXTRACTION W/PHACO Left 03/11/2019   Procedure: CATARACT EXTRACTION PHACO AND INTRAOCULAR LENS PLACEMENT LEFT EYE;  Surgeon: Baruch Goldmann, MD;   Location: [facility];  Service: Ophthalmology;  Laterality: Left;  CDE: 11.60  . TONSILLECTOMY    . TUBAL LIGATION      Social History   Socioeconomic History  . Marital status: Divorced    Spouse name: Not on file  . Number of children: Not on file  . Years of education: Not on file  . Highest education level: Not on file  Occupational History  . Occupation: Probation officer Dept  Social Needs  . Financial resource strain: Not on file  . Food insecurity    Worry: Not on file    Inability: Not on file  . Transportation needs    Medical: Not on file    Non-medical: Not on file  Tobacco Use  . Smoking status: Never Smoker  . Smokeless tobacco: Never Used  Substance and Sexual Activity  . Alcohol use: No  . Drug use: No  . Sexual activity: Not on file  Lifestyle  . Physical activity    Days per week: Not on file    Minutes per session: Not on file  . Stress: Not on file  Relationships  . Social Herbalist on phone: Not on file    Gets together: Not on file    Attends religious service: Not on file    Active member of club or organization: Not on file    Attends meetings of clubs or organizations: Not on file  Relationship status: Not on file  . Intimate partner violence    Fear of current or ex partner: Not on file    Emotionally abused: Not on file    Physically abused: Not on file    Forced sexual activity: Not on file  Other Topics Concern  . Not on file  Social History Narrative   Divorced.    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   Rides Stationary Bike 4 miles 4 days per week. --20 minutes     Vitals:   06/25/19 1357  BP: 130/65  Pulse: (!) 54  Temp: (!) 96.9 F (36.1 C)  TempSrc: Temporal  SpO2: 97%  Height: 5\' 10"  (1.778 m)    Wt Readings from Last 3 Encounters:  06/24/19 198 lb 9.6 oz (90.1 kg)  04/24/19 195 lb (88.5 kg)  03/11/19 198 lb (89.8 kg)     PHYSICAL EXAM General: NAD  HEENT: Normal. Neck: No JVD, no thyromegaly. Lungs: Clear to auscultation bilaterally with normal respiratory effort. CV: Regular rate and irregular rhythm, normal S1/S2, no S3, no murmur. No pretibial or periankle edema.  No carotid bruit.   Abdomen: Soft, nontender, no distention.  Neurologic: Alert and oriented.  Psych: Normal affect. Skin: Normal. Musculoskeletal: No gross deformities.      Labs: Lab Results  Component Value Date/Time   K 4.9 07/12/2018 09:30 AM   BUN 16 07/12/2018 09:30 AM   CREATININE 0.77 07/12/2018 09:30 AM   ALT 15 07/12/2018 09:30 AM   TSH 2.14 03/11/2019 04:51 PM   HGB 14.6 07/12/2018 09:30 AM     Lipids: Lab Results  Component Value Date/Time   LDLCALC 117 (H) 07/12/2018 09:30 AM   CHOL 197 07/12/2018 09:30 AM   TRIG 104 07/12/2018 09:30 AM   HDL 59 07/12/2018 09:30 AM       ASSESSMENT AND PLAN: 1.  Atrial fibrillation and flutter: Currently on Lopressor 25 mg twice daily. Event monitor and echocardiogram reviewed above.  There were several episodes of both rapid atrial fibrillation and flutter.  She is symptomatic.  I will increase Lopressor to 50 mg twice daily. With respect to anticoagulation, Eliquis was too expensive so she resumed aspirin 81 mg daily.  I warned her about the increased thromboembolic risk without systemic anticoagulation.  I spoke with her about a more affordable option such as warfarin but she declined.  We will provide her with samples of Eliquis and provide her with the paperwork for patient assistance.b  2.  Hypertension: Controlled on present therapy.  I will monitor as I am increasing Lopressor to 50 mg bid.     Disposition: Follow up 3 months   Kate Sable, M.D., F.A.C.C.

## 2019-06-25 NOTE — Patient Instructions (Signed)
Medication Instructions:  INCREASE lopressor to 50 mg twice a day   STOP ASPIRIN   TAKE Eliquis 5 mg twice a day   *If you need a refill on your cardiac medications before your next appointment, please call your pharmacy*  Lab Work: None today If you have labs (blood work) drawn today and your tests are completely normal, you will receive your results only by: Marland Kitchen MyChart Message (if you have MyChart) OR . A paper copy in the mail If you have any lab test that is abnormal or we need to change your treatment, we will call you to review the results.  Testing/Procedures: None today  Follow-Up: At [facility], you and your health needs are our priority.  As part of our continuing mission to provide you with exceptional heart care, we have created designated Provider Care Teams.  These Care Teams include your primary Cardiologist (physician) and Advanced Practice Providers (APPs -  Physician Assistants and Nurse Practitioners) who all work together to provide you with the care you need, when you need it.  Your next appointment:   3 months  The format for your next appointment:   In Person  Provider:   Kate Sable, MD  Other Instructions None     Thank you for choosing [facility] !

## 2019-07-05 ENCOUNTER — Other Ambulatory Visit (HOSPITAL_COMMUNITY)
Admission: RE | Admit: 2019-07-05 | Discharge: 2019-07-05 | Disposition: A | Discharge status: Home / Self Care | Payer: Medicare Other | Source: Ambulatory Visit | Attending: Internal Medicine | Admitting: Internal Medicine

## 2019-07-05 ENCOUNTER — Other Ambulatory Visit: Payer: Self-pay

## 2019-07-05 DIAGNOSIS — Z20828 Contact with and (suspected) exposure to other viral communicable diseases: Secondary | ICD-10-CM | POA: Diagnosis not present

## 2019-07-05 DIAGNOSIS — Z01812 Encounter for preprocedural laboratory examination: Secondary | ICD-10-CM | POA: Diagnosis not present

## 2019-07-05 LAB — SARS CORONAVIRUS 2 (TAT 6-24 HRS): SARS Coronavirus 2: NEGATIVE

## 2019-07-09 ENCOUNTER — Ambulatory Visit (HOSPITAL_COMMUNITY)
Admission: RE | Admit: 2019-07-09 | Discharge: 2019-07-09 | Disposition: A | Discharge status: Home / Self Care | Payer: Medicare Other | Attending: Internal Medicine | Admitting: Internal Medicine

## 2019-07-09 ENCOUNTER — Other Ambulatory Visit: Payer: Self-pay

## 2019-07-09 ENCOUNTER — Encounter (HOSPITAL_COMMUNITY)
Admission: RE | Disposition: A | Discharge status: Home / Self Care | Payer: Self-pay | Source: Home / Self Care | Attending: Internal Medicine

## 2019-07-09 DIAGNOSIS — Z8249 Family history of ischemic heart disease and other diseases of the circulatory system: Secondary | ICD-10-CM | POA: Diagnosis not present

## 2019-07-09 DIAGNOSIS — K635 Polyp of colon: Secondary | ICD-10-CM | POA: Diagnosis not present

## 2019-07-09 DIAGNOSIS — Z961 Presence of intraocular lens: Secondary | ICD-10-CM | POA: Insufficient documentation

## 2019-07-09 DIAGNOSIS — Z7982 Long term (current) use of aspirin: Secondary | ICD-10-CM | POA: Insufficient documentation

## 2019-07-09 DIAGNOSIS — Z9842 Cataract extraction status, left eye: Secondary | ICD-10-CM | POA: Diagnosis not present

## 2019-07-09 DIAGNOSIS — I4891 Unspecified atrial fibrillation: Secondary | ICD-10-CM | POA: Insufficient documentation

## 2019-07-09 DIAGNOSIS — I10 Essential (primary) hypertension: Secondary | ICD-10-CM | POA: Diagnosis not present

## 2019-07-09 DIAGNOSIS — R195 Other fecal abnormalities: Secondary | ICD-10-CM | POA: Insufficient documentation

## 2019-07-09 DIAGNOSIS — Z79899 Other long term (current) drug therapy: Secondary | ICD-10-CM | POA: Diagnosis not present

## 2019-07-09 DIAGNOSIS — D124 Benign neoplasm of descending colon: Secondary | ICD-10-CM | POA: Diagnosis not present

## 2019-07-09 DIAGNOSIS — I4892 Unspecified atrial flutter: Secondary | ICD-10-CM | POA: Insufficient documentation

## 2019-07-09 DIAGNOSIS — Z800 Family history of malignant neoplasm of digestive organs: Secondary | ICD-10-CM | POA: Diagnosis not present

## 2019-07-09 HISTORY — PX: COLONOSCOPY: SHX5424

## 2019-07-09 HISTORY — PX: POLYPECTOMY: SHX5525

## 2019-07-09 SURGERY — COLONOSCOPY
Anesthesia: Moderate Sedation

## 2019-07-09 MED ORDER — MEPERIDINE HCL 100 MG/ML IJ SOLN
INTRAMUSCULAR | Status: DC | PRN
  Administered 2019-07-09: 25 mg via INTRAVENOUS
  Administered 2019-07-09: 15 mg via INTRAVENOUS
  Administered 2019-07-09: 10 mg via INTRAVENOUS

## 2019-07-09 MED ORDER — ONDANSETRON HCL 4 MG/2ML IJ SOLN
INTRAMUSCULAR | Status: DC | PRN
  Administered 2019-07-09: 4 mg via INTRAVENOUS

## 2019-07-09 MED ORDER — MIDAZOLAM HCL 5 MG/5ML IJ SOLN
INTRAMUSCULAR | Status: DC | PRN
  Administered 2019-07-09 (×5): 1 mg via INTRAVENOUS
  Administered 2019-07-09: 2 mg via INTRAVENOUS

## 2019-07-09 MED ORDER — STERILE WATER FOR IRRIGATION IR SOLN
Status: DC | PRN
  Administered 2019-07-09: 11:00:00 1.5 mL

## 2019-07-09 MED ORDER — ONDANSETRON HCL 4 MG/2ML IJ SOLN
INTRAMUSCULAR | Status: AC
  Filled 2019-07-09: qty 2

## 2019-07-09 MED ORDER — MIDAZOLAM HCL 5 MG/5ML IJ SOLN
INTRAMUSCULAR | Status: AC
  Filled 2019-07-09: qty 10

## 2019-07-09 MED ORDER — SODIUM CHLORIDE 0.9 % IV SOLN
INTRAVENOUS | Status: DC
  Administered 2019-07-09: 10:00:00 via INTRAVENOUS

## 2019-07-09 MED ORDER — MEPERIDINE HCL 50 MG/ML IJ SOLN
INTRAMUSCULAR | Status: AC
  Filled 2019-07-09: qty 1

## 2019-07-09 NOTE — Discharge Instructions (Signed)
Colonoscopy Discharge Instructions  Read the instructions outlined below and refer to this sheet in the next few weeks. These discharge instructions provide you with general information on caring for yourself after you leave the hospital. Your doctor may also give you specific instructions. While your treatment has been planned according to the most current medical practices available, unavoidable complications occasionally occur. If you have any problems or questions after discharge, call Dr. Gala Romney at [phone]. ACTIVITY  You may resume your regular activity, but move at a slower pace for the next 24 hours.   Take frequent rest periods for the next 24 hours.   Walking will help get rid of the air and reduce the bloated feeling in your belly (abdomen).   No driving for 24 hours (because of the medicine (anesthesia) used during the test).    Do not sign any important legal documents or operate any machinery for 24 hours (because of the anesthesia used during the test).  NUTRITION  Drink plenty of fluids.   You may resume your normal diet as instructed by your doctor.   Begin with a light meal and progress to your normal diet. Heavy or fried foods are harder to digest and may make you feel sick to your stomach (nauseated).   Avoid alcoholic beverages for 24 hours or as instructed.  MEDICATIONS  You may resume your normal medications unless your doctor tells you otherwise.  WHAT YOU CAN EXPECT TODAY  Some feelings of bloating in the abdomen.   Passage of more gas than usual.   Spotting of blood in your stool or on the toilet paper.  IF YOU HAD POLYPS REMOVED DURING THE COLONOSCOPY:  No aspirin products for 7 days or as instructed.   No alcohol for 7 days or as instructed.   Eat a soft diet for the next 24 hours.  FINDING OUT THE RESULTS OF YOUR TEST Not all test results are available during your visit. If your test results are not back during the visit, make an appointment  with your caregiver to find out the results. Do not assume everything is normal if you have not heard from your caregiver or the medical facility. It is important for you to follow up on all of your test results.  SEEK IMMEDIATE MEDICAL ATTENTION IF:  You have more than a spotting of blood in your stool.   Your belly is swollen (abdominal distention).   You are nauseated or vomiting.   You have a temperature over 101.   You have abdominal pain or discomfort that is severe or gets worse throughout the day.   Colon polyp information provided  Further recommendations to follow pending review of pathology report  At patient request, I called son, Annie Main at [phone] and reviewed findings and recommendations   Colon Polyps  Polyps are tissue growths inside the body. Polyps can grow in many places, including the large intestine (colon). A polyp may be a round bump or a mushroom-shaped growth. You could have one polyp or several. Most colon polyps are noncancerous (benign). However, some colon polyps can become cancerous over time. Finding and removing the polyps early can help prevent this. What are the causes? The exact cause of colon polyps is not known. What increases the risk? You are more likely to develop this condition if you:  Have a family history of colon cancer or colon polyps.  Are older than 39 or older than 45 if you are African American.  Have inflammatory bowel  disease, such as ulcerative colitis or Crohn's disease.  Have certain hereditary conditions, such as: ? Familial adenomatous polyposis. ? Lynch syndrome. ? Turcot syndrome. ? Peutz-Jeghers syndrome.  Are overweight.  Smoke cigarettes.  Do not get enough exercise.  Drink too much alcohol.  Eat a diet that is high in fat and red meat and low in fiber.  Had childhood cancer that was treated with abdominal radiation. What are the signs or symptoms? Most polyps do not cause symptoms. If you have  symptoms, they may include:  Blood coming from your rectum when having a bowel movement.  Blood in your stool. The stool may look dark red or black.  Abdominal pain.  A change in bowel habits, such as constipation or diarrhea. How is this diagnosed? This condition is diagnosed with a colonoscopy. This is a procedure in which a lighted, flexible scope is inserted into the anus and then passed into the colon to examine the area. Polyps are sometimes found when a colonoscopy is done as part of routine cancer screening tests. How is this treated? Treatment for this condition involves removing any polyps that are found. Most polyps can be removed during a colonoscopy. Those polyps will then be tested for cancer. Additional treatment may be needed depending on the results of testing. Follow these instructions at home: Lifestyle  Maintain a healthy weight, or lose weight if recommended by your health care provider.  Exercise every day or as told by your health care provider.  Do not use any products that contain nicotine or tobacco, such as cigarettes and e-cigarettes. If you need help quitting, ask your health care provider.  If you drink alcohol, limit how much you have: ? 0-1 drink a day for women. ? 0-2 drinks a day for men.  Be aware of how much alcohol is in your drink. In the [place] one drink equals one 12 oz bottle of beer (355 mL), one 5 oz glass of wine (148 mL), or one 1 oz shot of hard liquor (44 mL). Eating and drinking   Eat foods that are high in fiber, such as fruits, vegetables, and whole grains.  Eat foods that are high in calcium and vitamin D, such as milk, cheese, yogurt, eggs, liver, fish, and broccoli.  Limit foods that are high in fat, such as fried foods and desserts.  Limit the amount of red meat and processed meat you eat, such as hot dogs, sausage, bacon, and lunch meats. General instructions  Keep all follow-up visits as told by your health care provider.  This is important. ? This includes having regularly scheduled colonoscopies. ? Talk to your health care provider about when you need a colonoscopy. Contact a health care provider if:  You have new or worsening bleeding during a bowel movement.  You have new or increased blood in your stool.  You have a change in bowel habits.  You lose weight for no known reason. Summary  Polyps are tissue growths inside the body. Polyps can grow in many places, including the colon.  Most colon polyps are noncancerous (benign), but some can become cancerous over time.  This condition is diagnosed with a colonoscopy.  Treatment for this condition involves removing any polyps that are found. Most polyps can be removed during a colonoscopy. This information is not intended to replace advice given to you by your health care provider. Make sure you discuss any questions you have with your health care provider. Document Released: 05/11/2004 Document Revised: 11/30/2017  Document Reviewed: 11/30/2017 Elsevier Patient Education  [employer].

## 2019-07-09 NOTE — Op Note (Signed)
[facility] Patient Name: Paige Martinez Procedure Date: 07/09/2019 10:49 AM MRN: [id number] Date of Birth: 11-23-1945 Attending MD: Norvel Richards , MD CSN: [id number] Age: 73 Admit Type: Outpatient Procedure:                Colonoscopy Indications:              Positive Cologuard test Providers:                Norvel Richards, MD, Gerome Sam, RN,                            Aram Candela Referring MD:              Medicines:                Midazolam 7 mg IV, Meperidine 50 mg IV, Ondansetron                            4 mg IV Complications:            No immediate complications. Estimated Blood Loss:     Estimated blood loss was minimal. Procedure:                Pre-Anesthesia Assessment:                           - Prior to the procedure, a History and Physical                            was performed, and patient medications and                            allergies were reviewed. The patient's tolerance of                            previous anesthesia was also reviewed. The risks                            and benefits of the procedure and the sedation                            options and risks were discussed with the patient.                            All questions were answered, and informed consent                            was obtained. Prior Anticoagulants: The patient has                            taken no previous anticoagulant or antiplatelet                            agents. After reviewing the risks and benefits, the  patient was deemed in satisfactory condition to                            undergo the procedure.                           After obtaining informed consent, the colonoscope                            was passed under direct vision. Throughout the                            procedure, the patient's blood pressure, pulse, and                            oxygen saturations were monitored continuously. The                             [id number] scope was introduced through                            the anus and advanced to the the cecum, identified                            by appendiceal orifice and ileocecal valve. Scope In: 11:14:58 AM Scope Out: [id number] AM Scope Withdrawal Time: 0 hours 8 minutes 0 seconds  Total Procedure Duration: 0 hours 31 minutes 46 seconds  Findings:      The perianal and digital rectal examinations were normal.      A 5 mm polyp was found in the descending colon. The polyp was sessile.       The polyp was removed with a cold snare. Resection and retrieval were       complete. Estimated blood loss was minimal.      The exam was otherwise without abnormality on direct and retroflexion       views. Rectum too small to retroflex. Seen well on?"face.       Normal-appearing rectal mucosa Impression:               Use.- One 5 mm polyp in the descending colon,                            removed with a cold snare. Resected and retrieved.                           - The examination was otherwise normal on direct                            and retroflexion views. Moderate Sedation:      Moderate (conscious) sedation was administered by the endoscopy nurse       and supervised by the endoscopist. The following parameters were       monitored: oxygen saturation, heart rate, blood pressure, respiratory       rate, EKG, adequacy of pulmonary ventilation, and response to care.       Total physician intraservice time was 37 minutes.  Recommendation:           - Patient has a contact number available for                            emergencies. The signs and symptoms of potential                            delayed complications were discussed with the                            patient. Return to normal activities tomorrow.                            Written discharge instructions were provided to the                            patient.                           -  Resume previous diet.                           - Continue present medications.                           - Repeat colonoscopy date to be determined after                            pending pathology results are reviewed for                            surveillance based on pathology results.                           - Return to GI office (date not yet determined). Procedure Code(s):        --- Professional ---                           [phone], Colonoscopy, flexible; with removal of                            tumor(s), polyp(s), or other lesion(s) by snare                            technique                           99153, Moderate sedation; each additional 15                            minutes intraservice time                           G0500, Moderate sedation services provided by the  same physician or other qualified health care                            professional performing a gastrointestinal                            endoscopic service that sedation supports,                            requiring the presence of an independent trained                            observer to assist in the monitoring of the                            patient's level of consciousness and physiological                            status; initial 15 minutes of intra-service time;                            patient age 85 years or older (additional time may                            be reported with [phone], as appropriate) Diagnosis Code(s):        --- Professional ---                           K63.5, Polyp of colon                           R19.5, Other fecal abnormalities CPT copyright 2019 American Medical Association. All rights reserved. The codes documented in this report are preliminary and upon coder review may  be revised to meet current compliance requirements. Cristopher Estimable. Alayah Knouff, MD Norvel Richards, MD 07/09/2019 11:52:36 AM This report has been signed  electronically. Number of Addenda: 0

## 2019-07-09 NOTE — Interval H&P Note (Signed)
History and Physical Interval Note:  07/09/2019 11:07 AM  Paige Martinez  has presented today for surgery, with the diagnosis of +Cologuard.  The various methods of treatment have been discussed with the patient and family. After consideration of risks, benefits and other options for treatment, the patient has consented to  Procedure(s) with comments: COLONOSCOPY (N/A) - 10:30AM as a surgical intervention.  The patient's history has been reviewed, patient examined, no change in status, stable for surgery.  I have reviewed the patient's chart and labs.  Questions were answered to the patient's satisfaction.     Nikki Glanzer  No change.  Diagnostic colonoscopy today per plan.  The risks, benefits, limitations, alternatives and imponderables have been reviewed with the patient. Questions have been answered. All parties are agreeable.

## 2019-07-10 ENCOUNTER — Encounter: Payer: Self-pay | Admitting: Internal Medicine

## 2019-07-10 LAB — SURGICAL PATHOLOGY

## 2019-07-12 ENCOUNTER — Encounter (HOSPITAL_COMMUNITY): Payer: Self-pay | Admitting: Internal Medicine

## 2019-09-24 ENCOUNTER — Other Ambulatory Visit: Payer: Self-pay

## 2019-09-24 ENCOUNTER — Encounter: Payer: Self-pay | Admitting: Cardiovascular Disease

## 2019-09-24 ENCOUNTER — Ambulatory Visit (INDEPENDENT_AMBULATORY_CARE_PROVIDER_SITE_OTHER): Payer: Medicare Other | Admitting: Cardiovascular Disease

## 2019-09-24 VITALS — BP 139/63 | HR 53 | Temp 97.8°F | Ht 70.0 in | Wt 203.0 lb

## 2019-09-24 DIAGNOSIS — I4892 Unspecified atrial flutter: Secondary | ICD-10-CM | POA: Diagnosis not present

## 2019-09-24 DIAGNOSIS — I4891 Unspecified atrial fibrillation: Secondary | ICD-10-CM

## 2019-09-24 DIAGNOSIS — I10 Essential (primary) hypertension: Secondary | ICD-10-CM | POA: Diagnosis not present

## 2019-09-24 DIAGNOSIS — Z7189 Other specified counseling: Secondary | ICD-10-CM

## 2019-09-24 MED ORDER — METOPROLOL TARTRATE 50 MG PO TABS: 75.0000 mg | ORAL_TABLET | Freq: Two times a day (BID) | ORAL | 3 refills | Status: DC

## 2019-09-24 NOTE — Progress Notes (Signed)
SUBJECTIVE: Paige Martinez presents for follow-up of atrial fibrillation and flutter.  At her initial visit with me on 04/24/2019, I started Lopressor 25 mg twice daily and apixaban 5 mg twice daily.  Event monitoring demonstrated sinus rhythm with atrial fibrillation, atrial flutter, PACs, and PVCs.  Echocardiogram on 04/25/2019 demonstrated normal LV systolic function, EF 55 to 60%, mild LVH, and grade 1 diastolic dysfunction.  The patient denies any symptoms of chest pain, shortness of breath, lightheadedness, dizziness, leg swelling, orthopnea, PND, and syncope.  Apixaban was too expensive so she stopped it and started taking aspirin.  At her last visit we talked about warfarin and she refused it.  She never received the paperwork for financial assistance for apixaban.  She continues to experience palpitations.  We talked about the possibility of direct-current cardioversion but she would need to be on anticoagulation for this.    Review of Systems: As per "subjective", otherwise negative.  No Known Allergies  Current Outpatient Medications  Medication Sig Dispense Refill  . apixaban (ELIQUIS) 5 MG TABS tablet Take 5 mg by mouth 2 (two) times daily.    Marland Kitchen aspirin EC 81 MG tablet Take 81 mg by mouth 2 (two) times daily.    . Calcium Citrate-Vitamin D (CALCIUM + D PO) Take 1 tablet by mouth daily.     . Cholecalciferol (VITAMIN D) 50 MCG (2000 UT) tablet Take 2,000 Units by mouth daily.    Marland Kitchen ibuprofen (ADVIL) 200 MG tablet Take 200 mg by mouth daily as needed for headache or moderate pain.    . metoprolol tartrate (LOPRESSOR) 50 MG tablet Take 1 tablet (50 mg total) by mouth 2 (two) times daily. 180 tablet 3  . Multiple Vitamin (MULTIVITAMIN) tablet Take 2 tablets by mouth at bedtime.     . Probiotic CAPS Take 2 capsules by mouth at bedtime.     No current facility-administered medications for this visit.    Past Medical History:  Diagnosis Date  . Atrial flutter ([place])   .  Hypertension   . 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 Vitamin D deficiency 04/18/2008    Past Surgical History:  Procedure Laterality Date  . CATARACT EXTRACTION W/PHACO Left 03/11/2019   Procedure: CATARACT EXTRACTION PHACO AND INTRAOCULAR LENS PLACEMENT LEFT EYE;  Surgeon: Baruch Goldmann, MD;  Location: [facility] ORS;  Service: Ophthalmology;  Laterality: Left;  CDE: 11.60  . COLONOSCOPY N/A 07/09/2019   Procedure: COLONOSCOPY;  Surgeon: Daneil Dolin, MD;  Location: [facility] ENDO SUITE;  Service: Endoscopy;  Laterality: N/A;  10:30AM  . POLYPECTOMY  07/09/2019   Procedure: POLYPECTOMY;  Surgeon: Daneil Dolin, MD;  Location: [facility] SU[facility];  Service: Endoscopy;;  . TONSILLECTOMY    . TUBAL LIGATION      Social History   Socioeconomic History  . Marital status: Divorced    Spouse name: Not on file  . Number of children: Not on file  . Years of education: Not on file  . Highest education level: Not on file  Occupational History  . 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 Smoking status: Never Smoker  . Smokeless tobacco: Never Used  Substance and Sexual Activity  . Alcohol use: No  . Drug use: No  . Sexual activity: Not on file  Other Topics Concern  . Not on file  Social History Narrative   Divorced.    One Child--Son (age 70 at 56 OV)   Works 3 days per week at [employer] in  Personnel Dept.   Rides Stationary Bike 4 miles 4 days per week. --20 minutes   Social Determinants of Health   Financial Resource Strain:   . Difficulty of Paying Living Expenses: Not on file  Food Insecurity:   . Worried About Charity fundraiser in the Last Year: Not on file  . Ran Out of Food in the Last Year: Not on file  Transportation Needs:   . Lack of Transportation (Medical): Not on file  . Lack of Transportation (Non-Medical): Not on file  Physical Activity:   . Days of Exercise per Week: Not on file  . Minutes of Exercise per Session: Not on file  Stress:   . Feeling of Stress : Not on file    Social Connections:   . Frequency of Communication with Friends and Family: Not on file  . Frequency of Social Gatherings with Friends and Family: Not on file  . Attends Religious Services: Not on file  . Active Member of Clubs or Organizations: Not on file  . Attends Archivist Meetings: Not on file  . Marital Status: Not on file  Intimate Partner Violence:   . Fear of Current or Ex-Partner: Not on file  . Emotionally Abused: Not on file  . Physically Abused: Not on file  . Sexually Abused: Not on file     Vitals:   09/24/19 1313  BP: 139/63  Pulse: (!) 53  Temp: 97.8 F (36.6 C)  SpO2: 98%  Weight: 203 lb (92.1 kg)  Height: 5\' 10"  (1.778 m)    Wt Readings from Last 3 Encounters:  09/24/19 203 lb (92.1 kg)  06/25/19 198 lb (89.8 kg)  06/24/19 198 lb 9.6 oz (90.1 kg)     PHYSICAL EXAM General: NAD HEENT: Normal. Neck: No JVD, no thyromegaly. Lungs: Clear to auscultation bilaterally with normal respiratory effort. CV: Regular rate and irregular rhythm, normal S1/S2, no S3, no murmur. No pretibial or periankle edema.  No carotid bruit.   Abdomen: Soft, nontender, no distention.  Neurologic: Alert and oriented.  Psych: Normal affect. Skin: Normal. Musculoskeletal: No gross deformities.      Labs: Lab Results  Component Value Date/Time   K 4.9 07/12/2018 09:30 AM   BUN 16 07/12/2018 09:30 AM   CREATININE 0.77 07/12/2018 09:30 AM   ALT 15 07/12/2018 09:30 AM   TSH 2.14 03/11/2019 04:51 PM   HGB 14.6 07/12/2018 09:30 AM     Lipids: Lab Results  Component Value Date/Time   LDLCALC 117 (H) 07/12/2018 09:30 AM   CHOL 197 07/12/2018 09:30 AM   TRIG 104 07/12/2018 09:30 AM   HDL 59 07/12/2018 09:30 AM       ASSESSMENT AND PLAN: 1.  Atrial fibrillation and flutter: Currently on Lopressor 50 mg twice daily.  She continues to experience palpitations on a daily basis.  I will increase to 75 mg twice daily. With respect to anticoagulation, Eliquis  was too expensive so she resumed aspirin 81 mg daily.  I warned her about the increased thromboembolic risk without systemic anticoagulation.  She refused warfarin on her last visit.  We talked about this again at length and she has now agreed to take warfarin and see if she is eligible for financial assistance for apixaban.  We talked about direct-current cardioversion but I told her that she would need to be on anticoagulation for this.  2.  Hypertension: Controlled on present therapy.  I will monitor as I am increasing Lopressor to 75  mg twice daily.   Disposition: Follow up 3 months virtual   Kate Sable, M.D., F.A.C.C.

## 2019-09-24 NOTE — Patient Instructions (Signed)
Medication Instructions:  STOP Aspirin  INCREASE Metoprolol to 75 mg twice a day   You will be scheduled to see Edrick Oh, RN, in Coumadin Clinic at the [facility] office  *If you need a refill on your cardiac medications before your next appointment, please call your pharmacy*  Lab Work: NONE If you have labs (blood work) drawn today and your tests are completely normal, you will receive your results only by: Marland Kitchen MyChart Message (if you have MyChart) OR . A paper copy in the mail If you have any lab test that is abnormal or we need to change your treatment, we will call you to review the results.  Testing/Procedures: NONE  Follow-Up: At [facility], you and your health needs are our priority.  As part of our continuing mission to provide you with exceptional heart care, we have created designated Provider Care Teams.  These Care Teams include your primary Cardiologist (physician) and Advanced Practice Providers (APPs -  Physician Assistants and Nurse Practitioners) who all work together to provide you with the care you need, when you need it.  Your next appointment:   3 month(s)  The format for your next appointment:   Virtual Visit   Provider:   Kate Sable, MD  Other Instructions I have given you a patient assistance form for Eliquis.Please complete and return to office.      Thank you for choosing [facility] !

## 2019-09-25 ENCOUNTER — Ambulatory Visit (INDEPENDENT_AMBULATORY_CARE_PROVIDER_SITE_OTHER): Payer: Medicare Other | Admitting: *Deleted

## 2019-09-25 DIAGNOSIS — I4892 Unspecified atrial flutter: Secondary | ICD-10-CM

## 2019-09-25 DIAGNOSIS — I4891 Unspecified atrial fibrillation: Secondary | ICD-10-CM | POA: Diagnosis not present

## 2019-09-25 DIAGNOSIS — Z5181 Encounter for therapeutic drug level monitoring: Secondary | ICD-10-CM | POA: Diagnosis not present

## 2019-09-25 LAB — POCT INR: INR: 0.9 — AB (ref 2.0–3.0)

## 2019-09-25 MED ORDER — WARFARIN SODIUM 5 MG PO TABS: 5.0000 mg | ORAL_TABLET | Freq: Every day | ORAL | 3 refills | Status: DC

## 2019-09-30 DIAGNOSIS — I4891 Unspecified atrial fibrillation: Secondary | ICD-10-CM | POA: Insufficient documentation

## 2019-09-30 DIAGNOSIS — Z5181 Encounter for therapeutic drug level monitoring: Secondary | ICD-10-CM | POA: Insufficient documentation

## 2019-09-30 LAB — POCT INR: INR: 0.9 — AB (ref 2.0–3.0)

## 2019-09-30 NOTE — Patient Instructions (Signed)
Here to initiate warfarin INR 1/27 was 0.9   INR from 2/1 entered in error  Will be rechecked on 2.2 Start warfarin 5mg  (1 tablet) daily.  Recheck on 10/01/19

## 2019-10-01 ENCOUNTER — Other Ambulatory Visit: Payer: Self-pay

## 2019-10-01 ENCOUNTER — Ambulatory Visit (INDEPENDENT_AMBULATORY_CARE_PROVIDER_SITE_OTHER): Payer: Medicare Other | Admitting: *Deleted

## 2019-10-01 DIAGNOSIS — I4892 Unspecified atrial flutter: Secondary | ICD-10-CM | POA: Diagnosis not present

## 2019-10-01 DIAGNOSIS — Z5181 Encounter for therapeutic drug level monitoring: Secondary | ICD-10-CM | POA: Diagnosis not present

## 2019-10-01 DIAGNOSIS — I4891 Unspecified atrial fibrillation: Secondary | ICD-10-CM

## 2019-10-01 LAB — POCT INR: INR: 2 (ref 2.0–3.0)

## 2019-10-01 NOTE — Patient Instructions (Signed)
Continue warfarin 5mg  (1 tablet) daily. Has had 6 doses. Recheck on 10/08/19

## 2019-10-08 ENCOUNTER — Other Ambulatory Visit: Payer: Self-pay

## 2019-10-08 ENCOUNTER — Ambulatory Visit (INDEPENDENT_AMBULATORY_CARE_PROVIDER_SITE_OTHER): Payer: Medicare Other | Admitting: *Deleted

## 2019-10-08 DIAGNOSIS — I4891 Unspecified atrial fibrillation: Secondary | ICD-10-CM

## 2019-10-08 DIAGNOSIS — Z5181 Encounter for therapeutic drug level monitoring: Secondary | ICD-10-CM

## 2019-10-08 DIAGNOSIS — I4892 Unspecified atrial flutter: Secondary | ICD-10-CM

## 2019-10-08 LAB — POCT INR: INR: 4 — AB (ref 2.0–3.0)

## 2019-10-08 NOTE — Patient Instructions (Signed)
Hold warfarin tonight then decrease dose to 1 tablet daily except 1/2 tablet on Mondays, Wednesdays and Fridays Recheck in 10 days

## 2019-10-10 DIAGNOSIS — Z23 Encounter for immunization: Secondary | ICD-10-CM | POA: Diagnosis not present

## 2019-10-21 ENCOUNTER — Ambulatory Visit (INDEPENDENT_AMBULATORY_CARE_PROVIDER_SITE_OTHER): Payer: Medicare Other | Admitting: *Deleted

## 2019-10-21 ENCOUNTER — Other Ambulatory Visit: Payer: Self-pay

## 2019-10-21 DIAGNOSIS — I4891 Unspecified atrial fibrillation: Secondary | ICD-10-CM | POA: Diagnosis not present

## 2019-10-21 DIAGNOSIS — I4892 Unspecified atrial flutter: Secondary | ICD-10-CM | POA: Diagnosis not present

## 2019-10-21 DIAGNOSIS — Z5181 Encounter for therapeutic drug level monitoring: Secondary | ICD-10-CM | POA: Diagnosis not present

## 2019-10-21 LAB — POCT INR: INR: 2.8 (ref 2.0–3.0)

## 2019-10-21 NOTE — Patient Instructions (Signed)
Pt took 1/2 tablet less that ordered this past week Will decrease dose to 1/2 tablet daily except 1 tablet on Tuesdays, Thursdays and Saturdays Recheck in 2 weeks

## 2019-11-05 ENCOUNTER — Other Ambulatory Visit: Payer: Self-pay

## 2019-11-05 ENCOUNTER — Ambulatory Visit (INDEPENDENT_AMBULATORY_CARE_PROVIDER_SITE_OTHER): Payer: Medicare Other | Admitting: *Deleted

## 2019-11-05 DIAGNOSIS — I4892 Unspecified atrial flutter: Secondary | ICD-10-CM

## 2019-11-05 DIAGNOSIS — Z5181 Encounter for therapeutic drug level monitoring: Secondary | ICD-10-CM

## 2019-11-05 DIAGNOSIS — I4891 Unspecified atrial fibrillation: Secondary | ICD-10-CM | POA: Diagnosis not present

## 2019-11-05 LAB — POCT INR: INR: 2.8 (ref 2.0–3.0)

## 2019-11-05 NOTE — Patient Instructions (Signed)
Continue 1/2 tablet daily except 1 tablet on Tuesdays, Thursdays and Saturdays Recheck in 4 weeks

## 2019-11-08 DIAGNOSIS — Z23 Encounter for immunization: Secondary | ICD-10-CM | POA: Diagnosis not present

## 2019-12-02 ENCOUNTER — Other Ambulatory Visit: Payer: Self-pay

## 2019-12-02 ENCOUNTER — Ambulatory Visit (INDEPENDENT_AMBULATORY_CARE_PROVIDER_SITE_OTHER): Payer: Medicare Other | Admitting: *Deleted

## 2019-12-02 DIAGNOSIS — I4891 Unspecified atrial fibrillation: Secondary | ICD-10-CM

## 2019-12-02 DIAGNOSIS — Z5181 Encounter for therapeutic drug level monitoring: Secondary | ICD-10-CM | POA: Diagnosis not present

## 2019-12-02 DIAGNOSIS — I4892 Unspecified atrial flutter: Secondary | ICD-10-CM

## 2019-12-02 LAB — POCT INR: INR: 3.4 — AB (ref 2.0–3.0)

## 2019-12-02 NOTE — Patient Instructions (Signed)
Hold warfarin tonight then decrease dose to 1/2 tablet daily except 1 tablet on Tuesdays and Saturdays Recheck in 4 weeks

## 2019-12-25 ENCOUNTER — Telehealth: Payer: Medicare Other | Admitting: Cardiovascular Disease

## 2019-12-30 ENCOUNTER — Ambulatory Visit (INDEPENDENT_AMBULATORY_CARE_PROVIDER_SITE_OTHER): Payer: Medicare Other | Admitting: *Deleted

## 2019-12-30 ENCOUNTER — Other Ambulatory Visit: Payer: Self-pay

## 2019-12-30 DIAGNOSIS — I4892 Unspecified atrial flutter: Secondary | ICD-10-CM | POA: Diagnosis not present

## 2019-12-30 DIAGNOSIS — I4891 Unspecified atrial fibrillation: Secondary | ICD-10-CM

## 2019-12-30 DIAGNOSIS — Z5181 Encounter for therapeutic drug level monitoring: Secondary | ICD-10-CM | POA: Diagnosis not present

## 2019-12-30 LAB — POCT INR: INR: 1 — AB (ref 2.0–3.0)

## 2019-12-30 NOTE — Patient Instructions (Signed)
Has developed a red, raised, scaley rash on hands, neck and back.  She is convinced it is coming from the warfarin so she stopped warfarin 1 week ago.  She has not seen a doctor about this.  States she has an appt with Dr Raliegh Ip tomorrow and will discuss with him.  Told her she needs to discuss blood thinner options with Dr Raliegh Ip tomorrow.

## 2019-12-31 ENCOUNTER — Other Ambulatory Visit (HOSPITAL_COMMUNITY)
Admission: RE | Admit: 2019-12-31 | Discharge: 2019-12-31 | Disposition: A | Discharge status: Home / Self Care | Payer: Medicare Other | Source: Ambulatory Visit | Attending: Cardiovascular Disease | Admitting: Cardiovascular Disease

## 2019-12-31 ENCOUNTER — Other Ambulatory Visit: Payer: Self-pay

## 2019-12-31 ENCOUNTER — Ambulatory Visit (INDEPENDENT_AMBULATORY_CARE_PROVIDER_SITE_OTHER): Payer: Medicare Other | Admitting: Cardiovascular Disease

## 2019-12-31 ENCOUNTER — Encounter: Payer: Self-pay | Admitting: Cardiovascular Disease

## 2019-12-31 VITALS — BP 128/82 | HR 68 | Temp 97.9°F | Ht 70.0 in | Wt 208.0 lb

## 2019-12-31 DIAGNOSIS — R21 Rash and other nonspecific skin eruption: Secondary | ICD-10-CM | POA: Diagnosis not present

## 2019-12-31 DIAGNOSIS — I10 Essential (primary) hypertension: Secondary | ICD-10-CM

## 2019-12-31 DIAGNOSIS — I4892 Unspecified atrial flutter: Secondary | ICD-10-CM | POA: Diagnosis not present

## 2019-12-31 DIAGNOSIS — I4891 Unspecified atrial fibrillation: Secondary | ICD-10-CM | POA: Diagnosis not present

## 2019-12-31 LAB — CBC
HCT: 42.9 % (ref 36.0–46.0)
Hemoglobin: 13.7 g/dL (ref 12.0–15.0)
MCH: 29.1 pg (ref 26.0–34.0)
MCHC: 31.9 g/dL (ref 30.0–36.0)
MCV: 91.1 fL (ref 80.0–100.0)
Platelets: 308 10*3/uL (ref 150–400)
RBC: 4.71 MIL/uL (ref 3.87–5.11)
RDW: 13.8 % (ref 11.5–15.5)
WBC: 9.1 10*3/uL (ref 4.0–10.5)
nRBC: 0 % (ref 0.0–0.2)

## 2019-12-31 NOTE — Patient Instructions (Signed)
Medication Instructions: Your physician recommends that you continue on your current medications as directed. Please refer to the Current Medication list given to you today.   Labwork: CBC today   Procedures/Testing: None today   Follow-Up:To be determined with Dr.Koneswaran   Any Additional Special Instructions Will Be Listed Below (If Applicable).   We have placed referral to [facility], they will contact you for an apt.  We have place a referral to electrophysiology, Dr.Allred.The [facility] office will call you to set up an apt.  If you need a refill on your cardiac medications before your next appointment, please call your pharmacy.

## 2019-12-31 NOTE — Progress Notes (Addendum)
SUBJECTIVE: Paige Martinez presents for follow-up of atrial fibrillation and flutter.    Event monitoring demonstrated sinus rhythm with atrial fibrillation, atrial flutter, PACs, and PVCs.  Echocardiogram on 04/25/2019 demonstrated normal LV systolic function, EF 55 to 60%, mild LVH, and grade 1 diastolic dysfunction.  At her last visit on 09/23/2018 she agreed to start warfarin as apixaban was too expensive.  About a month ago she started developing a rash on both hands.  It is not pruritic but erythematous.  It then spread to her torso.  She has episodic palpitations.  I reviewed the blood pressure and heart rate log.   Social history: She has a son and daughter-in-law as well as grandchild and great grandchild who lives locally.  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  Review of Systems: As per "subjective", otherwise negative.  No Known Allergies  Current Outpatient Medications  Medication Sig Dispense Refill  . Calcium Citrate-Vitamin D (CALCIUM + D PO) Take 1 tablet by mouth daily.     . Cholecalciferol (VITAMIN D) 50 MCG (2000 UT) tablet Take 2,000 Units by mouth daily.    Marland Kitchen ibuprofen (ADVIL) 200 MG tablet Take 200 mg by mouth daily as needed for headache or moderate pain.    . metoprolol tartrate (LOPRESSOR) 50 MG tablet Take 1.5 tablets (75 mg total) by mouth 2 (two) times daily. 270 tablet 3  . Multiple Vitamin (MULTIVITAMIN) tablet Take 2 tablets by mouth at bedtime.     . Probiotic CAPS Take 2 capsules by mouth at bedtime.     No current facility-administered medications for this visit.    Past Medical History:  Diagnosis Date  . Atrial flutter ([place])   . Hypertension   . 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 Vitamin D deficiency 04/18/2008    Past Surgical History:  Procedure Laterality Date  . CATARACT EXTRACTION W/PHACO Left 03/11/2019   Procedure: CATARACT EXTRACTION PHACO AND INTRAOCULAR  LENS PLACEMENT LEFT EYE;  Surgeon: Baruch Goldmann, MD;  Location: [facility];  Service: Ophthalmology;  Laterality: Left;  CDE: 11.60  . COLONOSCOPY N/A 07/09/2019   Procedure: COLONOSCOPY;  Surgeon: Daneil Dolin, MD;  Location: [facility];  Service: Endoscopy;  Laterality: N/A;  10:30AM  . POLYPECTOMY  07/09/2019   Procedure: POLYPECTOMY;  Surgeon: Daneil Dolin, MD;  Location: [facility];  Service: Endoscopy;;  . TONSILLECTOMY    . TUBAL LIGATION      Social History   Socioeconomic History  . Marital status: Divorced    Spouse name: Not on file  . Number of children: Not on file  . Years of education: Not on file  . Highest education level: Not on file  Occupational History  . 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 Smoking status: Never Smoker  . Smokeless tobacco: Never Used  Substance and Sexual Activity  . Alcohol use: No  . Drug use: No  . Sexual activity: Not on file  Other Topics Concern  . Not on file  Social History Narrative   Divorced.    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   Rides Stationary Bike 4 miles 4 days per week. --20 minutes   Social Determinants of Health   Financial Resource Strain:   . Difficulty of Paying Living Expenses:   Food Insecurity:   .  Worried About Charity fundraiser in the Last Year:   . Arboriculturist in the Last Year:   Transportation Needs:   . Film/video editor (Medical):   Marland Kitchen Lack of Transportation (Non-Medical):   Physical Activity:   . Days of Exercise per Week:   . Minutes of Exercise per Session:   Stress:   . Feeling of Stress :   Social Connections:   . Frequency of Communication with Friends and Family:   . Frequency of Social Gatherings with Friends and Family:   . Attends Religious Services:   . Active Member of Clubs or Organizations:   . Attends Archivist Meetings:   Marland Kitchen Marital Status:   Intimate Partner Violence:   . Fear of  Current or Ex-Partner:   . Emotionally Abused:   Marland Kitchen Physically Abused:   . Sexually Abused:    Barbarann Ehlers, RN was present throughout the entirety of the encounter.   Vitals:   12/31/19 1119  BP: 128/82  Pulse: 68  Temp: 97.9 F (36.6 C)  SpO2: 97%  Weight: 208 lb (94.3 kg)  Height: 5\' 10"  (1.778 m)    Wt Readings from Last 3 Encounters:  12/31/19 208 lb (94.3 kg)  09/24/19 203 lb (92.1 kg)  06/25/19 198 lb (89.8 kg)     PHYSICAL EXAM General: NAD HEENT: Normal. Neck: No JVD, no thyromegaly. Lungs: Clear to auscultation bilaterally with normal respiratory effort. CV: Regular rate and irregular rhythm, normal S1/S2, no S3, no murmur. No pretibial or periankle edema.   Abdomen: Soft, nontender, no distention.  Neurologic: Alert and oriented.  Psych: Normal affect. Skin: Erythematous rash seen on dorsum of both hands and on torso. Musculoskeletal: No gross deformities.      Labs: Lab Results  Component Value Date/Time   K 4.9 07/12/2018 09:30 AM   BUN 16 07/12/2018 09:30 AM   CREATININE 0.77 07/12/2018 09:30 AM   ALT 15 07/12/2018 09:30 AM   TSH 2.14 03/11/2019 04:51 PM   HGB 14.6 07/12/2018 09:30 AM     Lipids: Lab Results  Component Value Date/Time   LDLCALC 117 (H) 07/12/2018 09:30 AM   CHOL 197 07/12/2018 09:30 AM   TRIG 104 07/12/2018 09:30 AM   HDL 59 07/12/2018 09:30 AM       ASSESSMENT AND PLAN: 1.  Atrial fibrillation and flutter: Heart rate is controlled on Lopressor 75 mg twice daily.  She initially tried warfarin because Eliquis was too expensive.  She stopped it about a month ago because she developed a diffuse erythematous rash.  She is convinced it is due to warfarin.  Warfarin in rare instances has been known to cause skin necrosis.  I will make referral to dermatology.  I will check a CBC.  She is now agreeable to paying for Eliquis.  I also spoke to her about the possibility of ablation.  She is agreeable.  I will make a referral to  EP.  2.  Hypertension: Controlled on present therapy.  No changes.  3. Rash: She stopped taking warfarin about a month ago because she developed a diffuse erythematous rash.  She is convinced it is due to warfarin.  Warfarin in rare instances has been known to cause skin necrosis.  I will make referral to dermatology.  I will check a CBC.    Disposition: Follow up with EP.  Follow-up with me as needed.   Kate Sable, M.D., F.A.C.C.

## 2020-01-01 ENCOUNTER — Encounter: Payer: Self-pay | Admitting: Internal Medicine

## 2020-01-01 ENCOUNTER — Ambulatory Visit (INDEPENDENT_AMBULATORY_CARE_PROVIDER_SITE_OTHER): Payer: Medicare Other | Admitting: Internal Medicine

## 2020-01-01 VITALS — BP 138/68 | HR 110 | Ht 70.0 in | Wt 207.0 lb

## 2020-01-01 DIAGNOSIS — I480 Paroxysmal atrial fibrillation: Secondary | ICD-10-CM

## 2020-01-01 DIAGNOSIS — I10 Essential (primary) hypertension: Secondary | ICD-10-CM

## 2020-01-01 DIAGNOSIS — I4891 Unspecified atrial fibrillation: Secondary | ICD-10-CM

## 2020-01-01 DIAGNOSIS — Z79899 Other long term (current) drug therapy: Secondary | ICD-10-CM

## 2020-01-01 DIAGNOSIS — Z01812 Encounter for preprocedural laboratory examination: Secondary | ICD-10-CM | POA: Diagnosis not present

## 2020-01-01 DIAGNOSIS — D6869 Other thrombophilia: Secondary | ICD-10-CM

## 2020-01-01 DIAGNOSIS — I4892 Unspecified atrial flutter: Secondary | ICD-10-CM | POA: Diagnosis not present

## 2020-01-01 NOTE — Patient Instructions (Signed)
Medication Instructions:  Your physician recommends that you continue on your current medications as directed. Please refer to the Current Medication list given to you today.  *If you need a refill on your cardiac medications before your next appointment, please call your pharmacy*   Lab Work: None ordered If you have labs (blood work) drawn today and your tests are completely normal, you will receive your results only by: Marland Kitchen MyChart Message (if you have MyChart) OR . A paper copy in the mail If you have any lab test that is abnormal or we need to change your treatment, we will call you to review the results.   Testing/Procedures: Your physician has requested that you have cardiac CT within 7 days prior to your  ablation. Cardiac computed tomography (CT) is a painless test that uses an x-ray machine to take clear, detailed pictures of your heart. For further information please visit [email or web address]. Please follow instruction sheet as given.   Your physician has recommended that you have an ablation. Catheter ablation is a medical procedure used to treat some cardiac arrhythmias (irregular heartbeats). During catheter ablation, a long, thin, flexible tube is put into a blood vessel in your groin (upper thigh), or neck. This tube is called an ablation catheter. It is then guided to your heart through the blood vessel. Radio frequency waves destroy small areas of heart tissue where abnormal heartbeats may cause an arrhythmia to start. Please see the instruction sheet given to you today.   Follow-Up: At [facility], you and your health needs are our priority.  As part of our continuing mission to provide you with exceptional heart care, we have created designated Provider Care Teams.  These Care Teams include your primary Cardiologist (physician) and Advanced Practice Providers (APPs -  Physician Assistants and Nurse Practitioners) who all work together to provide you with the care you need,  when you need it.  We recommend signing up for the patient portal called "MyChart".  Sign up information is provided on this After Visit Summary.  MyChart is used to connect with patients for Virtual Visits (Telemedicine).  Patients are able to view lab/test results, encounter notes, upcoming appointments, etc.  Non-urgent messages can be sent to your provider as well.   To learn more about what you can do with MyChart, go to [email or web address].    Your next appointment:   4 week(s)  The format for your next appointment:   In Person  Provider:   AFib clinic   Your physician recommends that you schedule a follow-up appointment in: 3 months, after your ablation, with Dr. Rayann Heman.   Thank you for choosing [facility]!!      Other Instructions  CT INSTRUCTIONS Your cardiac CT will be scheduled at:   [facility] [address] [place], [place] [address] [phone]  OR  Please arrive at the [facility] main entrance of [facility] on __________ at __________, please arrive 30 minutes prior to test start time. Proceed to the [facility] Radiology Department (first floor) to check-in and test prep.  Please follow these instructions carefully (unless otherwise directed):  On the Night Before the Test: . Be sure to Drink plenty of water. . Do not consume any caffeinated/decaffeinated beverages or chocolate 12 hours prior to your test. . Do not take any antihistamines 12 hours prior to your test.  On the Day of the Test: . Drink plenty of water. Do not drink any water within one hour of  the test. . Do not eat any food 4 hours prior to the test. . You may take your regular medications prior to the test.  . Take your metoprolol (Lopressor) two hours prior to test. . HOLD Furosemide/Hydrochlorothiazide morning of the test. . FEMALES- please wear underwire-free bra if available       After the Test: . Drink plenty of water. . After receiving IV  contrast, you may experience a mild flushed feeling. This is normal. . On occasion, you may experience a mild rash up to 24 hours after the test. This is not dangerous. If this occurs, you can take Benadryl 25 mg and increase your fluid intake. . If you experience trouble breathing, this can be serious. If it is severe call 911 IMMEDIATELY. If it is mild, please call our office.   Once we have confirmed authorization from your insurance company, we will call you to set up a date and time for your test.   For non-scheduling related questions, please contact the cardiac imaging nurse navigator should you have any questions/concerns: Marchia Bond, RN Navigator Cardiac Imaging [facility] [phone] office  For scheduling needs, including cancellations and rescheduling, please call [phone].      Electrophysiology/Ablation Procedure Instructions   You are scheduled for a(n)  ablation on 02/11/2020 with Dr. Tinnie Gens.   1.   Pre procedure testing-             A.  LAB WORK --- the first week of June, for your pre procedure blood work.   You may have this done at [place] TEST-- On 02/07/2020 @ 2:15 pm- [facility] go to [facility]  Stay in your car and the nurse team will come to your car to test you.  After you are tested please go home and self quarantine until the day of your procedure.     2. On the day of your procedure 02/11/2020 you will go to [facility] [phone]) at 7:30 am.  Dennis Bast will go to the main entrance A [employer]) and enter where the [employer] are.  Your driver will drop you off and you will head down the hallway to ADMITTING.  You may have one support person come in to the hospital with you.  They will be asked to wait in the waiting room.   3.   Do not eat or drink after midnight prior to your procedure.   4.   Do NOT take any medications the morning of your procedure.   5.   Plan for an overnight stay.  If you use your phone frequently bring your phone charger.   6. You will follow up with the AFIB clinic 4 weeks after your procedure.  You will follow up with Dr. Rayann Heman 3 months after your procedure.  These appointments will be made for you.   * If you have ANY questions please call the office [phone] and ask for [facility] or send me a MyChart message   * Occasionally, EP Studies and ablations can become lengthy.  Please make your family aware of this before your procedure starts.  Average time ranges from 2-8 hours for EP studies/ablations.  Your physician will call your family after the procedure with the results.  Cardiac Ablation Cardiac ablation is a procedure to disable (ablate) a small amount of heart tissue in very specific places. The heart has many electrical connections. Sometimes these connections are abnormal and can cause the heart to beat very fast or irregularly. Ablating some of the problem areas can improve the heart rhythm or return it to normal. Ablation may be done for people who:  Have Wolff-Parkinson-White syndrome.  Have fast heart rhythms (tachycardia).  Have taken medicines for an abnormal heart rhythm (arrhythmia) that were not effective or caused side effects.  Have a high-risk heartbeat that may be life-threatening. During the procedure, a small incision is made in the neck or the groin, and a long, thin, flexible tube (catheter) is inserted into the incision and moved to the heart. Small devices (electrodes) on the tip of the catheter will send out electrical currents. A type of X-ray (fluoroscopy) will be used to help guide the catheter and to provide images of the heart. Tell a health care provider about:  Any allergies you have.  All medicines you are taking, including vitamins, herbs, eye drops, creams, and over-the-counter medicines.  Any problems you or family members have had with  anesthetic medicines.  Any blood disorders you have.  Any surgeries you have had.  Any medical conditions you have, such as kidney failure.  Whether you are pregnant or may be pregnant. What are the risks? Generally, this is a safe procedure. However, problems may occur, including:  Infection.  Bruising and bleeding at the catheter insertion site.  Bleeding into the chest, especially into the sac that surrounds the heart. This is a serious complication.  Stroke or blood clots.  Damage to other structures or organs.  Allergic reaction to medicines or dyes.  Need for a permanent pacemaker if the normal electrical system is damaged. A pacemaker is a small computer that sends electrical signals to the heart and helps your heart beat normally.  The procedure not being fully effective. This may not be recognized until months later. Repeat ablation procedures are sometimes required. What happens before the procedure?  Follow instructions from your health care provider about eating or drinking restrictions.  Ask your health care provider about: ? Changing or stopping your regular medicines. This is especially important if you are taking diabetes medicines or blood thinners. ? Taking medicines such as aspirin and ibuprofen. These medicines can thin your blood. Do not take these medicines before your procedure if your health care provider instructs you not to.  Plan to have someone take you home from the hospital or clinic.  If you will be going home right after the procedure, plan to have someone with you for 24 hours. What happens during the procedure?  To lower your risk of infection: ? Your health care team will wash or sanitize their hands. ? Your skin will be washed with soap. ? Hair may be removed from the incision area.  An IV tube will be inserted into one of your veins.  You will be given a medicine to help you relax (sedative).  The skin on your neck or groin will be  numbed.  An incision will be made in your neck or your groin.  A needle will be inserted through the incision and into a large vein in your neck or groin.  A catheter will be inserted into the needle and moved to your heart.  Dye may be injected through the catheter to help your surgeon see the area of the  heart that needs treatment.  Electrical currents will be sent from the catheter to ablate heart tissue in desired areas. There are three types of energy that may be used to ablate heart tissue: ? Heat (radiofrequency energy). ? Laser energy. ? Extreme cold (cryoablation).  When the necessary tissue has been ablated, the catheter will be removed.  Pressure will be held on the catheter insertion area to prevent excessive bleeding.  A bandage (dressing) will be placed over the catheter insertion area. The procedure may vary among health care providers and hospitals. What happens after the procedure?  Your blood pressure, heart rate, breathing rate, and blood oxygen level will be monitored until the medicines you were given have worn off.  Your catheter insertion area will be monitored for bleeding. You will need to lie still for a few hours to ensure that you do not bleed from the catheter insertion area.  Do not drive for 24 hours or as long as directed by your health care provider. Summary  Cardiac ablation is a procedure to disable (ablate) a small amount of heart tissue in very specific places. Ablating some of the problem areas can improve the heart rhythm or return it to normal.  During the procedure, electrical currents will be sent from the catheter to ablate heart tissue in desired areas. This information is not intended to replace advice given to you by your health care provider. Make sure you discuss any questions you have with your health care provider. Document Revised: 02/05/2018 Document Reviewed: 07/04/2016 Elsevier Patient Education  [place].

## 2020-01-01 NOTE — Addendum Note (Signed)
Addended by: Stanton Kidney on: 01/01/2020 03:15 PM   Modules accepted: Orders

## 2020-01-01 NOTE — Progress Notes (Signed)
Electrophysiology Office Note   Date:  01/01/2020   ID:  Darshana, [place] 1946/06/23, MRN [id number]  PCP:  Susy Frizzle, MD  Cardiologist:  Dr Bronson Ing Primary Electrophysiologist: Thompson Grayer, MD    CC: afib   History of Present Illness: Paige Martinez is a 74 y.o. female who presents today for electrophysiology evaluation.   She is referred by Dr Bronson Ing for EP consultation regarding atrial fibrillation and atrial flutter.  He reports being diagnosed with atrial fibrillation at time of cataract surgery 02/2019.  She was evaluated by Dr Bronson Ing and started on metoprolol.  She was placed on coumadin.  She developed a rash which she attributes to warfarin.  She was subsequently switched to eliquis but she has not started this yet. She reports symptomatic of palpitations and "fluttering".  She has fatigue and decreased exercise tolerance.  She is not very active but reports that she "tires out" early.  + dizziness  Today, she denies symptoms of chest pain, shortness of breath, orthopnea, PND, lower extremity edema,  presyncope, syncope, bleeding, or neurologic sequela. The patient is tolerating medications without difficulties and is otherwise without complaint today.    Past Medical History:  Diagnosis Date  . Atrial flutter ([place])   . Hypertension   . Paroxysmal atrial fibrillation ([facility])   . 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 Vitamin D deficiency 04/18/2008   Past Surgical History:  Procedure Laterality Date  . CATARACT EXTRACTION W/PHACO Left 03/11/2019   Procedure: CATARACT EXTRACTION PHACO AND INTRAOCULAR LENS PLACEMENT LEFT EYE;  Surgeon: Baruch Goldmann, MD;  Location: [facility];  Service: Ophthalmology;  Laterality: Left;  CDE: 11.60  . COLONOSCOPY N/A 07/09/2019   Procedure: COLONOSCOPY;  Surgeon: Daneil Dolin, MD;  Location: [facility];  Service: Endoscopy;  Laterality: N/A;  10:30AM  . POLYPECTOMY  07/09/2019   Procedure: POLYPECTOMY;  Surgeon: Daneil Dolin, MD;  Location: [facility];  Service: Endoscopy;;  . TONSILLECTOMY    . TUBAL LIGATION       Current Outpatient Medications  Medication Sig Dispense Refill  . Calcium Citrate-Vitamin D (CALCIUM + D PO) Take 1 tablet by mouth daily.     . Cholecalciferol (VITAMIN D) 50 MCG (2000 UT) tablet Take 2,000 Units by mouth daily.    Marland Kitchen ibuprofen (ADVIL) 200 MG tablet Take 200 mg by mouth daily as needed for headache or moderate pain.    . Multiple Vitamin (MULTIVITAMIN) tablet Take 1 tablet by mouth at bedtime.     . Probiotic CAPS Take 2 capsules by mouth at bedtime.    . metoprolol tartrate (LOPRESSOR) 50 MG tablet Take 1.5 tablets (75 mg total) by mouth 2 (two) times daily. 270 tablet 3   No current facility-administered medications for this visit.    Allergies:   Patient has no known allergies.   Social History:  The patient  reports that she has never smoked. She has never used smokeless tobacco. She reports that she does not drink alcohol or use drugs.   Family History:  The patient's  family history includes Alcohol abuse in her father; Cancer (age of onset: 6) in her father; Colon cancer in her paternal grandmother; Hypertension in her brother and sister.    ROS:  Please see the history of present illness.   All other systems are personally reviewed and negative.    PHYSICAL EXAM: VS:  BP 138/68   Pulse (!) 110   Ht 5\' 10"  (1.778 m)  Wt 207 lb (93.9 kg)   SpO2 94%   BMI 29.70 kg/m  , BMI Body mass index is 29.7 kg/m. GEN: Well nourished, well developed, in no acute distress  HEENT: normal  Neck: no JVD, carotid bruits, or masses Cardiac: iRRR ,no edema  Respiratory:   normal work of breathing GI: soft  MS: no deformity or atrophy  Skin: warm and dry, macular rash on hands and trunk  Neuro:  Strength and sensation are intact Psych: euthymic mood, full affect  EKG:  EKG is ordered today. The ekg ordered today is personally reviewed and shows afib, V rate 110 bpm    Recent Labs: 03/11/2019: TSH 2.14 12/31/2019: Hemoglobin 13.7; Platelets 308  personally reviewed   Lipid Panel     Component Value Date/Time   CHOL 197 07/12/2018 0930   TRIG 104 07/12/2018 0930   HDL 59 07/12/2018 0930   CHOLHDL 3.3 07/12/2018 0930   VLDL 19 06/16/2016 0917   LDLCALC 117 (H) 07/12/2018 0930   personally reviewed   Wt Readings from Last 3 Encounters:  01/01/20 207 lb (93.9 kg)  12/31/19 208 lb (94.3 kg)  09/24/19 203 lb (92.1 kg)     Other studies personally reviewed: Additional studies/ records that were reviewed today include: Dr Raylene Everts notes, prior monitors, echo  Review of the above records today demonstrates: as above,   Echo 04/25/19- EF 55-60%, no significant valve disease, normal atria  ASSESSMENT AND PLAN:  1.  Paroxysmal atrial fibrillation/ atrial flutter The patient has symptomatic, recurrent paroxysmal atrial fibrillation and atrial flutter. Though prior monitors suggest paroxysmal afib, I suspect that she is now persistent. she has failed medical therapy with metoprolol. Chads2vasc score is 3.  she is anticoagulated with eliquis . Therapeutic strategies for afib including medicine (flecainide, tikosyn, amiodarone) and ablation were discussed in detail with the patient today. Risk, benefits, and alternatives to EP study and radiofrequency ablation for afib were also discussed in detail today. These risks include but are not limited to stroke, bleeding, vascular damage, tamponade, perforation, damage to the esophagus, lungs, and other structures, pulmonary vein stenosis, worsening renal function, and death. The patient understands these risk and wishes to proceed.  We will therefore proceed with catheter ablation at the next available time.  Carto, ICE, anesthesia are requested for the procedure.  Will also obtain cardiac CT prior to the procedure to exclude LAA thrombus and further evaluate atrial anatomy.  2. HTN Stable No change required  today    Signed, Thompson Grayer, MD  01/01/2020 2:26 PM     [place] [address] [phone] (office) [phone] (fax)

## 2020-01-02 LAB — BASIC METABOLIC PANEL
BUN/Creatinine Ratio: 18 (ref 12–28)
BUN: 15 mg/dL (ref 8–27)
CO2: 23 mmol/L (ref 20–29)
Calcium: 10.2 mg/dL (ref 8.7–10.3)
Chloride: 105 mmol/L (ref 96–106)
Creatinine, Ser: 0.82 mg/dL (ref 0.57–1.00)
GFR calc Af Amer: 82 mL/min/{1.73_m2} (ref >59)
GFR calc non Af Amer: 71 mL/min/{1.73_m2} (ref >59)
Glucose: 94 mg/dL (ref 65–99)
Potassium: 4.8 mmol/L (ref 3.5–5.2)
Sodium: 144 mmol/L (ref 134–144)

## 2020-01-03 ENCOUNTER — Telehealth: Payer: Self-pay | Admitting: *Deleted

## 2020-01-03 MED ORDER — APIXABAN 5 MG PO TABS
5.0000 mg | ORAL_TABLET | Freq: Two times a day (BID) | ORAL | 6 refills | Status: DC
Start: 2020-01-03 — End: 2020-01-06

## 2020-01-03 NOTE — Telephone Encounter (Signed)
Pt instructed to begin Eliquis 5 mg BID. Advised to start tonight. Rx sent to pharmacy. Patient verbalized understanding and agreeable to plan.    Stanton Kidney, RN

## 2020-01-06 ENCOUNTER — Telehealth: Payer: Self-pay

## 2020-01-06 MED ORDER — RIVAROXABAN 20 MG PO TABS
20.0000 mg | ORAL_TABLET | Freq: Every day | ORAL | 11 refills | Status: DC
Start: 2020-01-06 — End: 2020-01-06

## 2020-01-06 MED ORDER — APIXABAN 5 MG PO TABS
5.0000 mg | ORAL_TABLET | Freq: Two times a day (BID) | ORAL | 11 refills | Status: DC
Start: 2020-01-06 — End: 2021-08-19

## 2020-01-06 NOTE — Telephone Encounter (Signed)
Dose should be 20mg  daily

## 2020-01-06 NOTE — Telephone Encounter (Addendum)
**Note De-Identified Cimberly Stoffel Obfuscation** I have sent Xarelto 20 mg #30 with 11 refills to [facility] in [place] to fill.  I then called [facility] and was advised that the cost for Xarelto to the pt will be $457/30 day supply and that the cost for Eliquis was $464/30 day supply.  I have called the pt to make her aware of change in medication/dosing and cost of each medication (Eliquis VS. Xarelto).  Per her request I am changing her RX back to Eliquis 5 mg BID as she feels that the card that was provided to her by Dr Bonita Quin office will work for her as she pays for her medication coverage separate from medicare.  I have advised her to call us if she finds that the cost for Eliquis is too much for her to afford. She verbalized understanding and thanked me for checking in with her.

## 2020-01-06 NOTE — Telephone Encounter (Signed)
Hi Paige Martinez, Xarelto would be fine. Thanks!

## 2020-01-06 NOTE — Telephone Encounter (Signed)
**Note De-Identified Naje Rice Obfuscation** Fax received from [facility] stating that they pts ins will not cover Eliquis but that they may cover Xarelto. Is it ok for the pt to take Xarelto and if so at what dose. Will forward this message to Dr Rayann Heman and his nurse for advisement.

## 2020-01-09 ENCOUNTER — Telehealth: Payer: Self-pay

## 2020-01-09 NOTE — Telephone Encounter (Signed)
Pt agreeable to first case on February 11, 2020.  Arrival time 5:30 am

## 2020-01-23 DIAGNOSIS — B352 Tinea manuum: Secondary | ICD-10-CM | POA: Diagnosis not present

## 2020-01-23 DIAGNOSIS — L0100 Impetigo, unspecified: Secondary | ICD-10-CM | POA: Diagnosis not present

## 2020-01-23 DIAGNOSIS — L0101 Non-bullous impetigo: Secondary | ICD-10-CM | POA: Diagnosis not present

## 2020-01-23 DIAGNOSIS — L309 Dermatitis, unspecified: Secondary | ICD-10-CM | POA: Diagnosis not present

## 2020-02-03 ENCOUNTER — Telehealth (HOSPITAL_COMMUNITY): Payer: Self-pay | Admitting: *Deleted

## 2020-02-03 NOTE — Telephone Encounter (Signed)

## 2020-02-05 ENCOUNTER — Other Ambulatory Visit: Payer: Self-pay

## 2020-02-05 ENCOUNTER — Ambulatory Visit (HOSPITAL_COMMUNITY)
Admission: RE | Admit: 2020-02-05 | Discharge: 2020-02-05 | Disposition: A | Discharge status: Home / Self Care | Payer: Medicare Other | Source: Ambulatory Visit | Attending: Internal Medicine | Admitting: Internal Medicine

## 2020-02-05 DIAGNOSIS — I4891 Unspecified atrial fibrillation: Secondary | ICD-10-CM | POA: Insufficient documentation

## 2020-02-05 LAB — POCT I-STAT CREATININE: Creatinine, Ser: 0.8 mg/dL (ref 0.44–1.00)

## 2020-02-05 MED ORDER — IOHEXOL 350 MG/ML SOLN
80.0000 mL | Freq: Once | INTRAVENOUS | Status: AC | PRN
  Administered 2020-02-05: 80 mL via INTRAVENOUS

## 2020-02-06 DIAGNOSIS — L309 Dermatitis, unspecified: Secondary | ICD-10-CM | POA: Diagnosis not present

## 2020-02-06 DIAGNOSIS — L2082 Flexural eczema: Secondary | ICD-10-CM | POA: Diagnosis not present

## 2020-02-07 ENCOUNTER — Other Ambulatory Visit (HOSPITAL_COMMUNITY)
Admission: RE | Admit: 2020-02-07 | Discharge: 2020-02-07 | Disposition: A | Discharge status: Home / Self Care | Payer: Medicare Other | Source: Ambulatory Visit | Attending: Internal Medicine | Admitting: Internal Medicine

## 2020-02-07 ENCOUNTER — Other Ambulatory Visit: Payer: Self-pay

## 2020-02-07 DIAGNOSIS — Z20822 Contact with and (suspected) exposure to covid-19: Secondary | ICD-10-CM | POA: Insufficient documentation

## 2020-02-07 DIAGNOSIS — Z01812 Encounter for preprocedural laboratory examination: Secondary | ICD-10-CM | POA: Insufficient documentation

## 2020-02-08 LAB — SARS CORONAVIRUS 2 (TAT 6-24 HRS): SARS Coronavirus 2: NEGATIVE

## 2020-02-11 ENCOUNTER — Ambulatory Visit (HOSPITAL_COMMUNITY): Payer: Medicare Other | Admitting: Anesthesiology

## 2020-02-11 ENCOUNTER — Ambulatory Visit (HOSPITAL_COMMUNITY)
Admission: RE | Admit: 2020-02-11 | Discharge: 2020-02-11 | Disposition: A | Discharge status: Home / Self Care | Payer: Medicare Other | Attending: Internal Medicine | Admitting: Internal Medicine

## 2020-02-11 ENCOUNTER — Encounter (HOSPITAL_COMMUNITY): Payer: Self-pay | Admitting: Internal Medicine

## 2020-02-11 ENCOUNTER — Encounter (HOSPITAL_COMMUNITY)
Admission: RE | Disposition: A | Discharge status: Home / Self Care | Payer: Self-pay | Source: Home / Self Care | Attending: Internal Medicine

## 2020-02-11 ENCOUNTER — Other Ambulatory Visit: Payer: Self-pay

## 2020-02-11 DIAGNOSIS — Z7901 Long term (current) use of anticoagulants: Secondary | ICD-10-CM | POA: Diagnosis not present

## 2020-02-11 DIAGNOSIS — I483 Typical atrial flutter: Secondary | ICD-10-CM | POA: Insufficient documentation

## 2020-02-11 DIAGNOSIS — I10 Essential (primary) hypertension: Secondary | ICD-10-CM | POA: Diagnosis not present

## 2020-02-11 DIAGNOSIS — I480 Paroxysmal atrial fibrillation: Secondary | ICD-10-CM | POA: Diagnosis not present

## 2020-02-11 DIAGNOSIS — Z79899 Other long term (current) drug therapy: Secondary | ICD-10-CM | POA: Diagnosis not present

## 2020-02-11 HISTORY — PX: ATRIAL FIBRILLATION ABLATION: EP1191

## 2020-02-11 LAB — CBC
HCT: 44.3 % (ref 36.0–46.0)
Hemoglobin: 14.3 g/dL (ref 12.0–15.0)
MCH: 29.3 pg (ref 26.0–34.0)
MCHC: 32.3 g/dL (ref 30.0–36.0)
MCV: 90.8 fL (ref 80.0–100.0)
Platelets: 191 10*3/uL (ref 150–400)
RBC: 4.88 MIL/uL (ref 3.87–5.11)
RDW: 13.4 % (ref 11.5–15.5)
WBC: 10 10*3/uL (ref 4.0–10.5)
nRBC: 0 % (ref 0.0–0.2)

## 2020-02-11 LAB — BASIC METABOLIC PANEL
Anion gap: 10 (ref 5–15)
BUN: 19 mg/dL (ref 8–23)
CO2: 24 mmol/L (ref 22–32)
Calcium: 9.2 mg/dL (ref 8.9–10.3)
Chloride: 105 mmol/L (ref 98–111)
Creatinine, Ser: 1.02 mg/dL — ABNORMAL HIGH (ref 0.44–1.00)
GFR calc Af Amer: >60 mL/min (ref >60)
GFR calc non Af Amer: 54 mL/min — ABNORMAL LOW (ref >60)
Glucose, Bld: 105 mg/dL — ABNORMAL HIGH (ref 70–99)
Potassium: 4.1 mmol/L (ref 3.5–5.1)
Sodium: 139 mmol/L (ref 135–145)

## 2020-02-11 LAB — POCT ACTIVATED CLOTTING TIME: Activated Clotting Time: 323 seconds

## 2020-02-11 SURGERY — ATRIAL FIBRILLATION ABLATION
Anesthesia: General

## 2020-02-11 MED ORDER — LIDOCAINE 2% (20 MG/ML) 5 ML SYRINGE
INTRAMUSCULAR | Status: DC | PRN
  Administered 2020-02-11: 30 mg via INTRAVENOUS

## 2020-02-11 MED ORDER — HEPARIN SODIUM (PORCINE) 1000 UNIT/ML IJ SOLN
INTRAMUSCULAR | Status: DC | PRN
Start: 2020-02-11 — End: 2020-02-11
  Administered 2020-02-11: 2000 [IU] via INTRAVENOUS

## 2020-02-11 MED ORDER — SUGAMMADEX SODIUM 200 MG/2ML IV SOLN
INTRAVENOUS | Status: DC | PRN
  Administered 2020-02-11: 200 mg via INTRAVENOUS

## 2020-02-11 MED ORDER — ONDANSETRON HCL 4 MG/2ML IJ SOLN
INTRAMUSCULAR | Status: DC | PRN
  Administered 2020-02-11: 4 mg via INTRAVENOUS

## 2020-02-11 MED ORDER — HEPARIN SODIUM (PORCINE) 1000 UNIT/ML IJ SOLN
INTRAMUSCULAR | Status: AC
  Filled 2020-02-11: qty 2

## 2020-02-11 MED ORDER — HEPARIN (PORCINE) IN NACL 1000-0.9 UT/500ML-% IV SOLN
INTRAVENOUS | Status: AC
  Filled 2020-02-11: qty 500

## 2020-02-11 MED ORDER — ISOPROTERENOL HCL 0.2 MG/ML IJ SOLN
INTRAVENOUS | Status: DC | PRN
  Administered 2020-02-11: 20 ug/min via INTRAVENOUS

## 2020-02-11 MED ORDER — HEPARIN SODIUM (PORCINE) 1000 UNIT/ML IJ SOLN
INTRAMUSCULAR | Status: DC | PRN
  Administered 2020-02-11: 1000 [IU] via INTRAVENOUS
  Administered 2020-02-11: 14000 [IU] via INTRAVENOUS

## 2020-02-11 MED ORDER — SODIUM CHLORIDE 0.9 % IV SOLN: 250.0000 mL | INTRAVENOUS | Status: DC | PRN

## 2020-02-11 MED ORDER — MIDAZOLAM HCL 2 MG/2ML IJ SOLN
INTRAMUSCULAR | Status: DC | PRN
  Administered 2020-02-11: 1 mg via INTRAVENOUS

## 2020-02-11 MED ORDER — SODIUM CHLORIDE 0.9% FLUSH: 3.0000 mL | INTRAVENOUS | Status: DC | PRN

## 2020-02-11 MED ORDER — EPHEDRINE SULFATE-NACL 50-0.9 MG/10ML-% IV SOSY
PREFILLED_SYRINGE | INTRAVENOUS | Status: DC | PRN
  Administered 2020-02-11: 10 mg via INTRAVENOUS

## 2020-02-11 MED ORDER — ISOPROTERENOL HCL 0.2 MG/ML IJ SOLN
INTRAMUSCULAR | Status: AC
  Filled 2020-02-11: qty 5

## 2020-02-11 MED ORDER — FENTANYL CITRATE (PF) 100 MCG/2ML IJ SOLN
INTRAMUSCULAR | Status: DC | PRN
  Administered 2020-02-11 (×2): 50 ug via INTRAVENOUS

## 2020-02-11 MED ORDER — PHENYLEPHRINE 40 MCG/ML (10ML) SYRINGE FOR IV PUSH (FOR BLOOD PRESSURE SUPPORT)
PREFILLED_SYRINGE | INTRAVENOUS | Status: DC | PRN
  Administered 2020-02-11: 80 ug via INTRAVENOUS
  Administered 2020-02-11: 120 ug via INTRAVENOUS

## 2020-02-11 MED ORDER — PROPOFOL 10 MG/ML IV BOLUS
INTRAVENOUS | Status: DC | PRN
  Administered 2020-02-11: 140 mg via INTRAVENOUS

## 2020-02-11 MED ORDER — APIXABAN 5 MG PO TABS
5.0000 mg | ORAL_TABLET | ORAL | Status: AC
  Administered 2020-02-11: 5 mg via ORAL
  Filled 2020-02-11 (×2): qty 1

## 2020-02-11 MED ORDER — DEXAMETHASONE SODIUM PHOSPHATE 10 MG/ML IJ SOLN
INTRAMUSCULAR | Status: DC | PRN
  Administered 2020-02-11: 10 mg via INTRAVENOUS

## 2020-02-11 MED ORDER — PROTAMINE SULFATE 10 MG/ML IV SOLN
INTRAVENOUS | Status: DC | PRN
Start: 2020-02-11 — End: 2020-02-11
  Administered 2020-02-11: 40 mg via INTRAVENOUS

## 2020-02-11 MED ORDER — SODIUM CHLORIDE 0.9 % IV SOLN: INTRAVENOUS | Status: DC

## 2020-02-11 MED ORDER — SODIUM CHLORIDE 0.9% FLUSH: 3.0000 mL | Freq: Two times a day (BID) | INTRAVENOUS | Status: DC

## 2020-02-11 MED ORDER — ROCURONIUM BROMIDE 10 MG/ML (PF) SYRINGE
PREFILLED_SYRINGE | INTRAVENOUS | Status: DC | PRN
  Administered 2020-02-11: 60 mg via INTRAVENOUS

## 2020-02-11 MED ORDER — HEPARIN (PORCINE) IN NACL 1000-0.9 UT/500ML-% IV SOLN
INTRAVENOUS | Status: DC | PRN
  Administered 2020-02-11: 500 mL

## 2020-02-11 MED ORDER — PANTOPRAZOLE SODIUM 40 MG PO TBEC
40.0000 mg | DELAYED_RELEASE_TABLET | Freq: Every day | ORAL | 0 refills | Status: DC
Start: 2020-02-11 — End: 2022-12-23

## 2020-02-11 SURGICAL SUPPLY — 18 items
BLANKET WARM UNDERBOD FULL ACC (MISCELLANEOUS) ×3 IMPLANT
CATH MAPPNG PENTARAY F 2-6-2MM (CATHETERS) ×1 IMPLANT
CATH SMTCH THERMOCOOL SF DF (CATHETERS) ×3 IMPLANT
CATH SOUNDSTAR ECO 8FR (CATHETERS) ×3 IMPLANT
CATH WEBSTER BI DIR CS D-F CRV (CATHETERS) ×3 IMPLANT
COVER SWIFTLINK CONNECTOR (BAG) ×3 IMPLANT
DEVICE CLOSURE PERCLS PRGLD 6F (VASCULAR PRODUCTS) ×3 IMPLANT
NEEDLE BAYLIS TRANSSEPTAL 71CM (NEEDLE) ×3 IMPLANT
PACK EP LATEX FREE (CUSTOM PROCEDURE TRAY) ×2
PACK EP LF (CUSTOM PROCEDURE TRAY) ×1 IMPLANT
PAD PRO RADIOLUCENT 2001M-C (PAD) ×3 IMPLANT
PATCH CARTO3 (PAD) ×3 IMPLANT
PENTARAY F 2-6-2MM (CATHETERS) ×3
PERCLOSE PROGLIDE 6F (VASCULAR PRODUCTS) ×9
SHEATH PINNACLE 7F 10CM (SHEATH) ×6 IMPLANT
SHEATH PINNACLE 9F 10CM (SHEATH) ×3 IMPLANT
SHEATH SWARTZ TS SL2 63CM 8.5F (SHEATH) ×3 IMPLANT
TUBING SMART ABLATE COOLFLOW (TUBING) ×3 IMPLANT

## 2020-02-11 NOTE — Anesthesia Preprocedure Evaluation (Signed)
Anesthesia Evaluation  Patient identified by MRN, date of birth, ID band Patient awake    Reviewed: Allergy & Precautions, NPO status , Patient's Chart, lab work & pertinent test results  Airway Mallampati: II  TM Distance: >3 FB Neck ROM: Full    Dental  (+) Teeth Intact, Dental Advisory Given   Pulmonary    breath sounds clear to auscultation       Cardiovascular hypertension,  Rhythm:Regular Rate:Normal     Neuro/Psych    GI/Hepatic   Endo/Other    Renal/GU      Musculoskeletal   Abdominal (+) + obese,   Peds  Hematology   Anesthesia Other Findings   Reproductive/Obstetrics                             Anesthesia Physical Anesthesia Plan  ASA: III  Anesthesia Plan: General   Post-op Pain Management:    Induction: Intravenous  PONV Risk Score and Plan: Ondansetron and Dexamethasone  Airway Management Planned: Oral ETT  Additional Equipment:   Intra-op Plan:   Post-operative Plan: Extubation in OR  Informed Consent: I have reviewed the patients History and Physical, chart, labs and discussed the procedure including the risks, benefits and alternatives for the proposed anesthesia with the patient or authorized representative who has indicated his/her understanding and acceptance.     Dental advisory given  Plan Discussed with: CRNA and Anesthesiologist  Anesthesia Plan Comments:         Anesthesia Quick Evaluation

## 2020-02-11 NOTE — Anesthesia Procedure Notes (Signed)
Procedure Name: Intubation Date/Time: 02/11/2020 7:47 AM Performed by: Genelle Bal, CRNA Pre-anesthesia Checklist: Patient identified, Emergency Drugs available, Suction available and Patient being monitored Patient Re-evaluated:Patient Re-evaluated prior to induction Oxygen Delivery Method: Circle system utilized Preoxygenation: Pre-oxygenation with 100% oxygen Induction Type: IV induction Ventilation: Mask ventilation without difficulty Laryngoscope Size: Miller and 2 Grade View: Grade I Tube type: Oral Tube size: 7.0 mm Number of attempts: 1 Airway Equipment and Method: Stylet and Oral airway Placement Confirmation: ETT inserted through vocal cords under direct vision,  positive ETCO2 and breath sounds checked- equal and bilateral Secured at: 21 cm Tube secured with: Tape Dental Injury: Teeth and Oropharynx as per pre-operative assessment

## 2020-02-11 NOTE — Transfer of Care (Signed)
Immediate Anesthesia Transfer of Care Note  Patient: Paige Martinez  Procedure(s) Performed: ATRIAL FIBRILLATION ABLATION (N/A )  Patient Location: Cath Lab  Anesthesia Type:General  Level of Consciousness: awake, alert  and oriented  Airway & Oxygen Therapy: Patient Spontanous Breathing and Patient connected to nasal cannula oxygen  Post-op Assessment: Report given to RN and Post -op Vital signs reviewed and stable  Post vital signs: Reviewed and stable  Last Vitals:  Vitals Value Taken Time  BP 117/61 02/11/20 1013  Temp    Pulse 64 02/11/20 1013  Resp 18 02/11/20 1013  SpO2 95 % 02/11/20 1013  Vitals shown include unvalidated device data.  Last Pain:  Vitals:   02/11/20 0957  TempSrc:   PainSc: 0-No pain         Complications: No complications documented.

## 2020-02-11 NOTE — Anesthesia Postprocedure Evaluation (Signed)
Anesthesia Post Note  Patient: Paige Martinez  Procedure(s) Performed: ATRIAL FIBRILLATION ABLATION (N/A )     Patient location during evaluation: Cath Lab Anesthesia Type: General Level of consciousness: awake and alert, oriented and patient cooperative Pain management: pain level controlled Vital Signs Assessment: post-procedure vital signs reviewed and stable Respiratory status: spontaneous breathing, nonlabored ventilation, respiratory function stable and patient connected to nasal cannula oxygen Cardiovascular status: blood pressure returned to baseline and stable Postop Assessment: no apparent nausea or vomiting Anesthetic complications: no   No complications documented.  Last Vitals:  Vitals:   02/11/20 1030 02/11/20 1035  BP: (!) 121/57 122/64  Pulse: 61 63  Resp: 16 (!) 9  Temp:    SpO2: 96% 96%    Last Pain:  Vitals:   02/11/20 1019  TempSrc: Temporal  PainSc: 0-No pain                 Dorsey Authement,E. Sharian Delia

## 2020-02-11 NOTE — H&P (Signed)
CC: afib   History of Present Illness: Paige Martinez is a 74 y.o. female who presents today for afib ablation.   He reports being diagnosed with atrial fibrillation at time of cataract surgery 02/2019.  She was evaluated by Dr Bronson Ing and started on metoprolol.  She was placed on coumadin.  She developed a rash which she attributes to warfarin.  She was subsequently switched to eliquis but she has not started this yet. She reports symptomatic of palpitations and "fluttering".  She has fatigue and decreased exercise tolerance.  She is not very active but reports that she "tires out" early.  + dizziness  Today, she denies symptoms of chest pain, shortness of breath, orthopnea, PND, lower extremity edema,  presyncope, syncope, bleeding, or neurologic sequela. The patient is tolerating medications without difficulties and is otherwise without complaint today.        Past Medical History:  Diagnosis Date  . Atrial flutter ([place])   . Hypertension   . Paroxysmal atrial fibrillation ([facility])   . 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 Vitamin D deficiency 04/18/2008        Past Surgical History:  Procedure Laterality Date  . CATARACT EXTRACTION W/PHACO Left 03/11/2019   Procedure: CATARACT EXTRACTION PHACO AND INTRAOCULAR LENS PLACEMENT LEFT EYE;  Surgeon: Baruch Goldmann, MD;  Location: [facility];  Service: Ophthalmology;  Laterality: Left;  CDE: 11.60  . COLONOSCOPY N/A 07/09/2019   Procedure: COLONOSCOPY;  Surgeon: Daneil Dolin, MD;  [facility];  Service: Endoscopy;  Laterality: N/A;  10:30AM  . POLYPECTOMY  07/09/2019   Procedure: POLYPECTOMY;  Surgeon: Daneil Dolin, MD;  Location: [facility];  Service: Endoscopy;;  . TONSILLECTOMY    . TUBAL LIGATION             Current Outpatient Medications  Medication Sig Dispense Refill  . Calcium Citrate-Vitamin D (CALCIUM + D PO) Take 1 tablet by mouth daily.     . Cholecalciferol (VITAMIN D) 50 MCG (2000 UT) tablet  Take 2,000 Units by mouth daily.    Marland Kitchen ibuprofen (ADVIL) 200 MG tablet Take 200 mg by mouth daily as needed for headache or moderate pain.    . Multiple Vitamin (MULTIVITAMIN) tablet Take 1 tablet by mouth at bedtime.     . Probiotic CAPS Take 2 capsules by mouth at bedtime.    . metoprolol tartrate (LOPRESSOR) 50 MG tablet Take 1.5 tablets (75 mg total) by mouth 2 (two) times daily. 270 tablet 3   No current facility-administered medications for this visit.    Allergies:   Patient has no known allergies.   Social History:  The patient  reports that she has never smoked. She has never used smokeless tobacco. She reports that she does not drink alcohol or use drugs.   Family History:  The patient's  family history includes Alcohol abuse in her father; Cancer (age of onset: 30) in her father; Colon cancer in her paternal grandmother; Hypertension in her brother and sister.    ROS:  Please see the history of present illness.   All other systems are personally reviewed and negative.    PHYSICAL EXAM: VS:  BP 138/68   Pulse (!) 110   Ht 5\' 10"  (1.778 m)   Wt 207 lb (93.9 kg)   SpO2 94%   BMI 29.70 kg/m  , BMI Body mass index is 29.7 kg/m. GEN: Well nourished, well developed, in no acute distress  HEENT: normal  Neck: no JVD, carotid  bruits, or masses Cardiac: iRRR ,no edema  Respiratory:   normal work of breathing GI: soft  MS: no deformity or atrophy  Skin: warm and dry, macular rash on hands and trunk  Neuro:  Strength and sensation are intact Psych: euthymic mood, full affect  EKG:  EKG is ordered today. The ekg ordered today is personally reviewed and shows afib, V rate 110 bpm   Recent Labs: 03/11/2019: TSH 2.14 12/31/2019: Hemoglobin 13.7; Platelets 308  personally reviewed   Lipid Panel  Labs (Brief)          Component Value Date/Time   CHOL 197 07/12/2018 0930   TRIG 104 07/12/2018 0930   HDL 59 07/12/2018 0930   CHOLHDL 3.3 07/12/2018  0930   VLDL 19 06/16/2016 0917   LDLCALC 117 (H) 07/12/2018 0930     personally reviewed      Wt Readings from Last 3 Encounters:  01/01/20 207 lb (93.9 kg)  12/31/19 208 lb (94.3 kg)  09/24/19 203 lb (92.1 kg)     Other studies personally reviewed: Additional studies/ records that were reviewed today include: Dr Raylene Everts notes, prior monitors, echo  Review of the above records today demonstrates: as above,   Echo 04/25/19- EF 55-60%, no significant valve disease, normal atria  ASSESSMENT AND PLAN:  1.  Paroxysmal atrial fibrillation/ atrial flutter The patient has symptomatic, recurrent paroxysmal atrial fibrillation and atrial flutter. Though prior monitors suggest paroxysmal afib, I suspect that she is now persistent. she has failed medical therapy with metoprolol. Chads2vasc score is 3.  she is anticoagulated with eliquis .  Risk, benefits, and alternatives to EP study and radiofrequency ablation for afib were also discussed in detail today. These risks include but are not limited to stroke, bleeding, vascular damage, tamponade, perforation, damage to the esophagus, lungs, and other structures, pulmonary vein stenosis, worsening renal function, and death. The patient understands these risk and wishes to proceed.    She reports compliance with eliquis without interruption  Cardiac CT reviewed with her today  Thompson Grayer MD, [place] 02/11/2020 7:28 AM

## 2020-02-11 NOTE — Discharge Instructions (Signed)
Post procedure care instructions No driving for 4 days. No lifting over 5 lbs for 1 week. No vigorous or sexual activity for 1 week. You may return to work/your usual activities on 02/18/20. Keep procedure site clean & dry. If you notice increased pain, swelling, bleeding or pus, call/return!  You may shower, but no soaking baths/hot tubs/pools for 1 week.    You have an appointment set up with the [facility].  Multiple studies have shown that being followed by a dedicated atrial fibrillation clinic in addition to the standard care you receive from your other physicians improves health. We believe that enrollment in the atrial fibrillation clinic will allow [place] to better care for you.   The phone number to the [facility] is [phone]. The clinic is staffed Monday through Friday from 8:30am to 5pm.  Parking Directions: The clinic is located in the Heart and Vascular Building connected to [facility]. 1)From [address] turn on to [address] and go to the 3rd entrance  (Heart and Vascular entrance) on the right. 2)Look to the right for Heart &Vascular Parking Garage. 3)A code for the entrance is required please call the clinic to receive this.   4)Take the elevators to the 1st floor. Registration is in the room with the glass walls at the end of the hallway.  If you have any trouble parking or locating the clinic, please don't hesitate to call [phone].  Cardiac Ablation, Care After  This sheet gives you information about how to care for yourself after your procedure. Your health care provider may also give you more specific instructions. If you have problems or questions, contact your health care provider. What can I expect after the procedure? After the procedure, it is common to have:  Bruising around your puncture site.  Tenderness around your puncture site.  Skipped heartbeats.  Tiredness (fatigue).  Follow these instructions at  home: Puncture site care   Follow instructions from your health care provider about how to take care of your puncture site. Make sure you: ? If present, leave stitches (sutures), skin glue, or adhesive strips in place. These skin closures may need to stay in place for up to 2 weeks. If adhesive strip edges start to loosen and curl up, you may trim the loose edges. Do not remove adhesive strips completely unless your health care provider tells you to do that.  Check your puncture site every day for signs of infection. Check for: ? Redness, swelling, or pain. ? Fluid or blood. If your puncture site starts to bleed, lie down on your back, apply firm pressure to the area, and contact your health care provider. ? Warmth. ? Pus or a bad smell. Driving  Do not drive for at least 4 days after your procedure or however long your health care provider recommends. (Do not resume driving if you have previously been instructed not to drive for other health reasons.)  Do not drive or use heavy machinery while taking prescription pain medicine. Activity  Avoid activities that take a lot of effort for at least 7 days after your procedure.  Do not lift anything that is heavier than 5 lb (4.5 kg) for one week.   No sexual activity for 1 week.   Return to your normal activities as told by your health care provider. Ask your health care provider what activities are safe for you. General instructions  Take over-the-counter and prescription medicines only as told by your health care  provider.  Do not use any products that contain nicotine or tobacco, such as cigarettes and e-cigarettes. If you need help quitting, ask your health care provider.  You may shower after 24 hours, but Do not take baths, swim, or use a hot tub for 1 week.   Do not drink alcohol for 24 hours after your procedure.  Keep all follow-up visits as told by your health care provider. This is important. Contact a health care provider  if:  You have redness, mild swelling, or pain around your puncture site.  You have fluid or blood coming from your puncture site that stops after applying firm pressure to the area.  Your puncture site feels warm to the touch.  You have pus or a bad smell coming from your puncture site.  You have a fever.  You have chest pain or discomfort that spreads to your neck, jaw, or arm.  You are sweating a lot.  You feel nauseous.  You have a fast or irregular heartbeat.  You have shortness of breath.  You are dizzy or light-headed and feel the need to lie down.  You have pain or numbness in the arm or leg closest to your puncture site. Get help right away if:  Your puncture site suddenly swells.  Your puncture site is bleeding and the bleeding does not stop after applying firm pressure to the area. These symptoms may represent a serious problem that is an emergency. Do not wait to see if the symptoms will go away. Get medical help right away. Call your local emergency services (911 in the [place] Do not drive yourself to the hospital. Summary  After the procedure, it is normal to have bruising and tenderness at the puncture site in your groin, neck, or forearm.  Check your puncture site every day for signs of infection.  Get help right away if your puncture site is bleeding and the bleeding does not stop after applying firm pressure to the area. This is a medical emergency. This information is not intended to replace advice given to you by your health care provider. Make sure you discuss any questions you have with your health care provider.

## 2020-02-12 ENCOUNTER — Encounter (HOSPITAL_COMMUNITY): Payer: Self-pay | Admitting: Internal Medicine

## 2020-02-24 ENCOUNTER — Ambulatory Visit: Payer: Self-pay | Admitting: *Deleted

## 2020-03-10 ENCOUNTER — Other Ambulatory Visit: Payer: Self-pay

## 2020-03-10 ENCOUNTER — Encounter (HOSPITAL_COMMUNITY): Payer: Self-pay | Admitting: Nurse Practitioner

## 2020-03-10 ENCOUNTER — Ambulatory Visit (HOSPITAL_COMMUNITY)
Admission: RE | Admit: 2020-03-10 | Discharge: 2020-03-10 | Disposition: A | Discharge status: Home / Self Care | Payer: Medicare Other | Source: Ambulatory Visit | Attending: Nurse Practitioner | Admitting: Nurse Practitioner

## 2020-03-10 VITALS — BP 140/94 | HR 61 | Ht 70.0 in | Wt 205.8 lb

## 2020-03-10 DIAGNOSIS — E559 Vitamin D deficiency, unspecified: Secondary | ICD-10-CM | POA: Insufficient documentation

## 2020-03-10 DIAGNOSIS — Z791 Long term (current) use of non-steroidal anti-inflammatories (NSAID): Secondary | ICD-10-CM | POA: Insufficient documentation

## 2020-03-10 DIAGNOSIS — I480 Paroxysmal atrial fibrillation: Secondary | ICD-10-CM

## 2020-03-10 DIAGNOSIS — I4892 Unspecified atrial flutter: Secondary | ICD-10-CM | POA: Diagnosis not present

## 2020-03-10 DIAGNOSIS — Z7901 Long term (current) use of anticoagulants: Secondary | ICD-10-CM | POA: Insufficient documentation

## 2020-03-10 DIAGNOSIS — D6869 Other thrombophilia: Secondary | ICD-10-CM | POA: Diagnosis not present

## 2020-03-10 DIAGNOSIS — Z79899 Other long term (current) drug therapy: Secondary | ICD-10-CM | POA: Diagnosis not present

## 2020-03-10 DIAGNOSIS — I10 Essential (primary) hypertension: Secondary | ICD-10-CM | POA: Diagnosis not present

## 2020-03-10 DIAGNOSIS — Z8249 Family history of ischemic heart disease and other diseases of the circulatory system: Secondary | ICD-10-CM | POA: Diagnosis not present

## 2020-03-10 MED ORDER — METOPROLOL TARTRATE 50 MG PO TABS
50.0000 mg | ORAL_TABLET | Freq: Two times a day (BID) | ORAL | Status: DC
Start: 2020-03-10 — End: 2020-05-20

## 2020-03-10 NOTE — Progress Notes (Signed)
Primary Care Physician: Susy Frizzle, MD Referring Physician:Dr. Allred Cardiologist:  Dr. Charlynn Grimes Paige Martinez is a 73 y.o. female with a h/o afib and flutter since 02/2019. She is in the afib clinic for one month f/u ablation. She reports that she has done well. No afib since the procedure. She has recently started feeling much better. No swallowing or groin  issues.   Today, she denies symptoms of palpitations, chest pain, shortness of breath, orthopnea, PND, lower extremity edema, dizziness, presyncope, syncope, or neurologic sequela. The patient is tolerating medications without difficulties and is otherwise without complaint today.   Past Medical History:  Diagnosis Date  . Atrial flutter ([place])   . Hypertension   . Paroxysmal atrial fibrillation (H[facility])   . 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 Vitamin D deficiency 04/18/2008   Past Surgical History:  Procedure Laterality Date  . ATRIAL FIBRILLATION ABLATION N/A 02/11/2020   Procedure: ATRIAL FIBRILLATION ABLATION;  Surgeon: Thompson Grayer, MD;  Location: [facility];  Service: Cardiovascular;  Laterality: N/A;  . CATARACT EXTRACTION W/PHACO Left 03/11/2019   Procedure: CATARACT EXTRACTION PHACO AND INTRAOCULAR LENS PLACEMENT LEFT EYE;  Surgeon: Baruch Goldmann, MD;  Location: [facility];  Service: Ophthalmology;  Laterality: Left;  CDE: 11.60  . COLONOSCOPY N/A 07/09/2019   Procedure: COLONOSCOPY;  Surgeon: Daneil Dolin, MD;  [facility];  Service: Endoscopy;  Laterality: N/A;  10:30AM  . POLYPECTOMY  07/09/2019   Procedure: POLYPECTOMY;  Surgeon: Daneil Dolin, MD;  [facility];  Service: Endoscopy;;  . TONSILLECTOMY    . TUBAL LIGATION      Current Outpatient Medications  Medication Sig Dispense Refill  . apixaban (ELIQUIS) 5 MG TABS tablet Take 1 tablet (5 mg total) by mouth 2 (two) times daily. 60 tablet 11  . Calcium Citrate-Vitamin D (CALCIUM + D PO) Take 1 tablet by mouth daily.     .  Cholecalciferol (VITAMIN D) 50 MCG (2000 UT) tablet Take 2,000 Units by mouth daily.    Marland Kitchen ibuprofen (ADVIL) 200 MG tablet Take 200 mg by mouth daily as needed for headache or moderate pain.    . metoprolol tartrate (LOPRESSOR) 50 MG tablet Take 1.5 tablets (75 mg total) by mouth 2 (two) times daily. 270 tablet 3  . Multiple Vitamin (MULTIVITAMIN) tablet Take 1 tablet by mouth at bedtime.     . pantoprazole (PROTONIX) 40 MG tablet Take 1 tablet (40 mg total) by mouth daily. 45 tablet 0  . Probiotic CAPS Take 2 capsules by mouth at bedtime.    . triamcinolone ointment (KENALOG) 0.5 % Apply 1 application topically in the morning and at bedtime.     No current facility-administered medications for this encounter.    No Known Allergies  Social History   Socioeconomic History  . Marital status: Divorced    Spouse name: Not on file  . Number of children: Not on file  . Years of education: Not on file  . Highest education level: Not on file  Occupational History  . 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 Smoking status: Never Smoker  . Smokeless tobacco: Never Used  Vaping Use  . Vaping Use: Never used  Substance and Sexual Activity  . Alcohol use: No  . Drug use: No  . Sexual activity: Not on file  Other Topics Concern  . Not on file  Social History Narrative   Divorced and lives in [place]   One  Child--Son (age 59 at 12 OV)   Retired- previously worked 32 years in [place] 4 miles 4 days per week. --20 minutes   Social Determinants of Health   Financial Resource Strain:   . Difficulty of Paying Living Expenses:   Food Insecurity:   . Worried About Charity fundraiser in the Last Year:   . Arboriculturist in the Last Year:   Transportation Needs:   . Film/video editor (Medical):   Marland Kitchen Lack of Transportation (Non-Medical):   Physical Activity:   . Days of Exercise per Week:   . Minutes of Exercise per Session:   Stress:   . Feeling of  Stress :   Social Connections:   . Frequency of Communication with Friends and Family:   . Frequency of Social Gatherings with Friends and Family:   . Attends Religious Services:   . Active Member of Clubs or Organizations:   . Attends Archivist Meetings:   Marland Kitchen Marital Status:   Intimate Partner Violence:   . Fear of Current or Ex-Partner:   . Emotionally Abused:   Marland Kitchen Physically Abused:   . Sexually Abused:     Family History  Problem Relation Age of Onset  . Alcohol abuse Father   . Cancer Father 61       Pancreatic Cancer  . Hypertension Sister   . Hypertension Brother   . Colon cancer Paternal Grandmother        greater than 9 years old    ROS- All systems are reviewed and negative except as per the HPI above  Physical Exam: Vitals:   03/10/20 1318  Pulse: 61  Weight: 93.4 kg  Height: 5\' 10"  (1.778 m)   Wt Readings from Last 3 Encounters:  03/10/20 93.4 kg  02/11/20 90.7 kg  01/01/20 93.9 kg    Labs: Lab Results  Component Value Date   NA 139 02/11/2020   K 4.1 02/11/2020   CL 105 02/11/2020   CO2 24 02/11/2020   GLUCOSE 105 (H) 02/11/2020   BUN 19 02/11/2020   CREATININE 1.02 (H) 02/11/2020   CALCIUM 9.2 02/11/2020   Lab Results  Component Value Date   INR 1.0 (A) 12/30/2019   Lab Results  Component Value Date   CHOL 197 07/12/2018   HDL 59 07/12/2018   LDLCALC 117 (H) 07/12/2018   TRIG 104 07/12/2018     GEN- The patient is well appearing, alert and oriented x 3 today.   Head- normocephalic, atraumatic Eyes-  Sclera clear, conjunctiva pink Ears- hearing intact Oropharynx- clear Neck- supple, no JVP Lymph- no cervical lymphadenopathy Lungs- Clear to ausculation bilaterally, normal work of breathing Heart- Regular rate and rhythm, no murmurs, rubs or gallops, PMI not laterally displaced GI- soft, NT, ND, + BS Extremities- no clubbing, cyanosis, or edema MS- no significant deformity or atrophy Skin- no rash or lesion Psych-  euthymic mood, full affect Neuro- strength and sensation are intact  EKG- NSR at 61 bpm, pr 172, qrs 78 ms, 438 ms    Assessment and Plan: 1. Afib/flutter S/p one month ablation Doing well No afib to report  Continue metoprolol 50 mg bid  as pt was having a hard time spitting the 50 mg tabs  for the 75 mg bid dose   2. CHA2DS2VASc of 3 Continue  eliquis 5 mg bid Reminded  not to interrupt anticoagulation for the 3 month healing period after ablation   3.  HTN Stable  Continue  to monitor at home   F/u with Dr. Rayann Heman 9/22  Geroge Baseman. Lindberg Zenon, [facility] [address] [place], [place] [id number] [phone]

## 2020-03-30 ENCOUNTER — Ambulatory Visit: Payer: Medicare Other | Admitting: Dermatology

## 2020-05-20 ENCOUNTER — Encounter: Payer: Self-pay | Admitting: Internal Medicine

## 2020-05-20 ENCOUNTER — Ambulatory Visit (INDEPENDENT_AMBULATORY_CARE_PROVIDER_SITE_OTHER): Payer: Medicare Other | Admitting: Internal Medicine

## 2020-05-20 ENCOUNTER — Other Ambulatory Visit: Payer: Self-pay

## 2020-05-20 VITALS — BP 132/78 | HR 57 | Ht 70.0 in | Wt 201.4 lb

## 2020-05-20 DIAGNOSIS — I4892 Unspecified atrial flutter: Secondary | ICD-10-CM | POA: Diagnosis not present

## 2020-05-20 DIAGNOSIS — I480 Paroxysmal atrial fibrillation: Secondary | ICD-10-CM | POA: Diagnosis not present

## 2020-05-20 DIAGNOSIS — I10 Essential (primary) hypertension: Secondary | ICD-10-CM | POA: Diagnosis not present

## 2020-05-20 MED ORDER — METOPROLOL TARTRATE 25 MG PO TABS
25.0000 mg | ORAL_TABLET | Freq: Two times a day (BID) | ORAL | 3 refills | Status: DC
Start: 2020-05-20 — End: 2021-07-27

## 2020-05-20 NOTE — Progress Notes (Signed)
PCP: Susy Frizzle, MD    Paige Martinez is a 74 y.o. female who presents today for routine electrophysiology followup.  Since his recent afib ablation, the patient reports doing very well.  she denies procedure related complications and is pleased with the results of the procedure.  Today, she denies symptoms of palpitations, chest pain, shortness of breath,  lower extremity edema, dizziness, presyncope, or syncope.  The patient is otherwise without complaint today.   Past Medical History:  Diagnosis Date  . Atrial flutter ([place])   . Hypertension   . Paroxysmal atrial fibrillation (H[facility])   . 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 Vitamin D deficiency 04/18/2008   Past Surgical History:  Procedure Laterality Date  . ATRIAL FIBRILLATION ABLATION N/A 02/11/2020   Procedure: ATRIAL FIBRILLATION ABLATION;  Surgeon: Thompson Grayer, MD;  Location: [facility];  Service: Cardiovascular;  Laterality: N/A;  . CATARACT EXTRACTION W/PHACO Left 03/11/2019   Procedure: CATARACT EXTRACTION PHACO AND INTRAOCULAR LENS PLACEMENT LEFT EYE;  Surgeon: Baruch Goldmann, MD;  Location: [facility];  Service: Ophthalmology;  Laterality: Left;  CDE: 11.60  . COLONOSCOPY N/A 07/09/2019   Procedure: COLONOSCOPY;  Surgeon: Daneil Dolin, MD;  [facility];  Service: Endoscopy;  Laterality: N/A;  10:30AM  . POLYPECTOMY  07/09/2019   Procedure: POLYPECTOMY;  Surgeon: Daneil Dolin, MD;  [facility];  Service: Endoscopy;;  . TONSILLECTOMY    . TUBAL LIGATION      ROS- all systems are personally reviewed and negatives except as per HPI above  Current Outpatient Medications  Medication Sig Dispense Refill  . apixaban (ELIQUIS) 5 MG TABS tablet Take 1 tablet (5 mg total) by mouth 2 (two) times daily. 60 tablet 11  . Calcium Citrate-Vitamin D (CALCIUM + D PO) Take 1 tablet by mouth daily.     . Cholecalciferol (VITAMIN D) 50 MCG (2000 UT) tablet Take 2,000 Units by mouth daily.    Marland Kitchen ibuprofen  (ADVIL) 200 MG tablet Take 200 mg by mouth daily as needed for headache or moderate pain.    . metoprolol tartrate (LOPRESSOR) 50 MG tablet Take 1 tablet (50 mg total) by mouth 2 (two) times daily.    . Multiple Vitamin (MULTIVITAMIN) tablet Take 1 tablet by mouth at bedtime.     . Probiotic CAPS Take 2 capsules by mouth at bedtime.    . triamcinolone ointment (KENALOG) 0.5 % Apply 1 application topically in the morning and at bedtime.    . pantoprazole (PROTONIX) 40 MG tablet Take 1 tablet (40 mg total) by mouth daily. 45 tablet 0   No current facility-administered medications for this visit.    Physical Exam: Vitals:   05/20/20 1346  BP: 132/78  Pulse: (!) 57  SpO2: 97%  Weight: 201 lb 6.4 oz (91.4 kg)  Height: 5\' 10"  (1.778 m)    GEN- The patient is well appearing, alert and oriented x 3 today.   Head- normocephalic, atraumatic Eyes-  Sclera clear, conjunctiva pink Ears- hearing intact Oropharynx- clear Lungs- , normal work of breathing Heart- Regular rate and rhythm  GI- soft  Extremities- no clubbing, cyanosis, or edema  EKG tracing ordered today is personally reviewed and shows sinus bradycardia  Assessment and Plan:  1. Paroxysmal atrial fibrillation/ atrial flutter Doing well s/p ablation chads2vasc score is 3.  She is on eliquis She worries about costs of eliquis long term.  She will see if any other [place] is on formulary with  her insurance. We discussed long term monitoring as an alternative to anticoagulation.  She will consider this further and look into costs with her insurance. We could also consider REACT AF if this becomes an option.  2. HTN Stable No change required today  3. Overweight Body mass index is 28.9 kg/m. She has lost 7 lbs and is working on this diligently.  Return to see me in 3 months  Thompson Grayer MD, [facility] 05/20/2020 2:14 PM

## 2020-05-20 NOTE — Patient Instructions (Addendum)
Medication Instructions:  Reduce your metoprolol to 25 mg two times a day  *If you need a refill on your cardiac medications before your next appointment, please call your pharmacy*  Lab Work: None ordered.  If you have labs (blood work) drawn today and your tests are completely normal, you will receive your results only by: Marland Kitchen MyChart Message (if you have MyChart) OR . A paper copy in the mail If you have any lab test that is abnormal or we need to change your treatment, we will call you to review the results.  Testing/Procedures: None ordered.  Follow-Up: At [facility], you and your health needs are our priority.  As part of our continuing mission to provide you with exceptional heart care, we have created designated Provider Care Teams.  These Care Teams include your primary Cardiologist (physician) and Advanced Practice Providers (APPs -  Physician Assistants and Nurse Practitioners) who all work together to provide you with the care you need, when you need it.  We recommend signing up for the patient portal called "MyChart".  Sign up information is provided on this After Visit Summary.  MyChart is used to connect with patients for Virtual Visits (Telemedicine).  Patients are able to view lab/test results, encounter notes, upcoming appointments, etc.  Non-urgent messages can be sent to your provider as well.   To learn more about what you can do with MyChart, go to [email or web address].    Your next appointment:   Your physician wants you to follow-up in: 08/17/20 at 9 am with Dr. Rayann Heman.    Other Instructions:  Codes  [phone] [id number]

## 2020-06-19 DIAGNOSIS — Z23 Encounter for immunization: Secondary | ICD-10-CM | POA: Diagnosis not present

## 2020-06-24 DIAGNOSIS — Z23 Encounter for immunization: Secondary | ICD-10-CM | POA: Diagnosis not present

## 2020-07-10 ENCOUNTER — Other Ambulatory Visit: Payer: Self-pay

## 2020-07-10 ENCOUNTER — Ambulatory Visit (INDEPENDENT_AMBULATORY_CARE_PROVIDER_SITE_OTHER): Payer: Medicare Other | Admitting: Family Medicine

## 2020-07-10 VITALS — BP 120/60 | HR 59 | Temp 97.5°F | Ht 70.0 in | Wt 202.0 lb

## 2020-07-10 DIAGNOSIS — E782 Mixed hyperlipidemia: Secondary | ICD-10-CM

## 2020-07-10 DIAGNOSIS — M8580 Other specified disorders of bone density and structure, unspecified site: Secondary | ICD-10-CM

## 2020-07-10 DIAGNOSIS — I4891 Unspecified atrial fibrillation: Secondary | ICD-10-CM | POA: Diagnosis not present

## 2020-07-10 DIAGNOSIS — Z1231 Encounter for screening mammogram for malignant neoplasm of breast: Secondary | ICD-10-CM

## 2020-07-10 DIAGNOSIS — Z0000 Encounter for general adult medical examination without abnormal findings: Secondary | ICD-10-CM

## 2020-07-10 DIAGNOSIS — Z0001 Encounter for general adult medical examination with abnormal findings: Secondary | ICD-10-CM

## 2020-07-10 DIAGNOSIS — Z136 Encounter for screening for cardiovascular disorders: Secondary | ICD-10-CM | POA: Diagnosis not present

## 2020-07-10 DIAGNOSIS — Z13220 Encounter for screening for lipoid disorders: Secondary | ICD-10-CM | POA: Diagnosis not present

## 2020-07-10 NOTE — Progress Notes (Signed)
Subjective:    Patient ID: Paige Martinez, female    DOB: 05-23-1946, 74 y.o.   MRN: [id number]  HPI Patient is a very pleasant 74 year old Caucasian female who presents today for complete physical exam.  She has a history of atrial fibrillation and underwent ablation from cardiology last year.  Due to cost, she discontinued her Eliquis.  She is taking aspirin 81 mg a day.  She is overdue for mammogram.  She is overdue for a bone density test.  Colonoscopy is up-to-date.  Due to her age she does not require a Pap smear.  Her immunizations are listed below Immunization History  Administered Date(s) Administered  . Influenza Whole 05/29/2013  . Influenza, High Dose Seasonal PF 06/22/2018, 05/31/2019  . Influenza,inj,Quad PF,6+ Mos 06/05/2014, 06/11/2015, 06/16/2016, 06/21/2017  . Influenza-Unspecified 06/19/2020  . Moderna SARS-COVID-2 Vaccination 10/10/2019, 11/08/2019, 06/24/2020  . Pneumococcal Conjugate-13 06/05/2014  . Pneumococcal Polysaccharide-23 04/17/2009  . Tdap 06/27/2013  . Zoster 06/10/2011   She denies any memory loss, falls, or depression Past Medical History:  Diagnosis Date  . Hypertension   . 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 Vitamin D deficiency 04/18/2008   Past Surgical History:  Procedure Laterality Date  . TONSILLECTOMY    . TUBAL LIGATION     Current Outpatient Medications on File Prior to Visit  Medication Sig Dispense Refill  . Calcium Citrate-Vitamin D (CALCIUM + D PO) Take 1 tablet by mouth daily.     . Cholecalciferol (VITAMIN D) 50 MCG (2000 UT) tablet Take 2,000 Units by mouth daily.    Marland Kitchen ibuprofen (ADVIL) 200 MG tablet Take 200 mg by mouth daily as needed for headache or moderate pain.    Marland Kitchen lisinopril (PRINIVIL,ZESTRIL) 5 MG tablet Take 1 tablet (5 mg total) by mouth daily. (Patient taking differently: Take 5 mg by mouth at bedtime. ) 90 tablet 3  . Multiple Vitamin (MULTIVITAMIN) tablet Take 1 tablet by mouth daily at 12 noon.      No current  facility-administered medications on file prior to visit.    No Known Allergies Social History   Socioeconomic History  . Marital status: Divorced    Spouse name: Not on file  . Number of children: Not on file  . Years of education: Not on file  . Highest education level: Not on file  Occupational History  . Occupation: Probation officer Dept  Social Needs  . Financial resource strain: Not on file  . Food insecurity    Worry: Not on file    Inability: Not on file  . Transportation needs    Medical: Not on file    Non-medical: Not on file  Tobacco Use  . Smoking status: Never Smoker  . Smokeless tobacco: Never Used  Substance and Sexual Activity  . Alcohol use: No  . Drug use: No  . Sexual activity: Not on file  Lifestyle  . Physical activity    Days per week: Not on file    Minutes per session: Not on file  . Stress: Not on file  Relationships  . Social Herbalist on phone: Not on file    Gets together: Not on file    Attends religious service: Not on file    Active member of club or organization: Not on file    Attends meetings of clubs or organizations: Not on file    Relationship status: Not on file  . Intimate partner violence    Fear of current or ex partner:  Not on file    Emotionally abused: Not on file    Physically abused: Not on file    Forced sexual activity: Not on file  Other Topics Concern  . Not on file  Social History Narrative   Divorced.    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   Rides Stationary Bike 4 miles 4 days per week. --20 minutes      Review of Systems  All other systems reviewed and are negative.      Objective:   Physical Exam Vitals signs reviewed.  Constitutional:      Appearance: Normal appearance. She is normal weight.  Cardiovascular:     Rate and Rhythm: Normal rate and regular rhythm.     Heart sounds: Normal heart sounds. No murmur.  Pulmonary:     Effort:  Pulmonary effort is normal. No respiratory distress.     Breath sounds: Normal breath sounds. No stridor. No wheezing or rhonchi.  Musculoskeletal:     Right lower leg: No edema.     Left lower leg: No edema.  Neurological:     Mental Status: She is alert.   Abdominal exam is normal with no tenderness to palpation, distention, with normal bowel sounds.  There is no thyromegaly on exam or lymphadenopathy.  There is no skin rash or abnormal moles seen on examination of her back, forehead, face, or arms.  Neurologic exam is normal with normal reflexes.  Cranial nerves II through XII are grossly intact with muscle strength 5/5 equal and symmetric in the upper and lower extremities.  TMs and canals are clear on examination.  There is no erythema or exudate in the posterior oropharynx.  Conjunctiva appear normal.        Assessment & Plan:  Encounter for screening mammogram for malignant neoplasm of breast - Plan: MM Digital Screening  Osteopenia, unspecified location - Plan: DG Bone Density  Atrial fibrillation, unspecified type ([place]) - Plan: COMPLETE METABOLIC PANEL WITH GFR, Lipid panel  Mixed hyperlipidemia  General medical exam  Patient denies any depression, falls, or memory loss.  We discussed the risk of stroke if she were having paroxysmal atrial fibrillation.  Patient is hesitant to resume Xarelto or Eliquis due to cost.  She also does not want to do Coumadin due to the inconvenience.  She will increase her aspirin to 325 mg a day.  I recommended the shingles vaccine this spring.  She is due for Pneumovax 23 at her earliest convenience as a booster.  I will schedule her for mammogram.  I will schedule her for a bone density test as she does have a history of osteopenia and her last bone density is over 5 years ago.  She does not require colonoscopy as this is up-to-date.  She does not require a Pap smear.  Her next colonoscopy will be when she was 74 years old as her colonoscopy did show  a tubular adenoma.

## 2020-07-11 LAB — LIPID PANEL
Cholesterol: 199 mg/dL (ref <200)
HDL: 59 mg/dL (ref >=50)
LDL Cholesterol (Calc): 121 mg/dL (calc) — ABNORMAL HIGH
Non-HDL Cholesterol (Calc): 140 mg/dL (calc) — ABNORMAL HIGH (ref <130)
Total CHOL/HDL Ratio: 3.4 (calc) (ref <5.0)
Triglycerides: 90 mg/dL (ref <150)

## 2020-07-11 LAB — COMPLETE METABOLIC PANEL WITH GFR
AG Ratio: 1.9 (calc) (ref 1.0–2.5)
ALT: 13 U/L (ref 6–29)
AST: 20 U/L (ref 10–35)
Albumin: 4 g/dL (ref 3.6–5.1)
Alkaline phosphatase (APISO): 123 U/L (ref 37–153)
BUN: 16 mg/dL (ref 7–25)
CO2: 24 mmol/L (ref 20–32)
Calcium: 9.1 mg/dL (ref 8.6–10.4)
Chloride: 107 mmol/L (ref 98–110)
Creat: 0.84 mg/dL (ref 0.60–0.93)
GFR, Est African American: 79 mL/min/{1.73_m2} (ref >=60)
GFR, Est Non African American: 68 mL/min/{1.73_m2} (ref >=60)
Globulin: 2.1 g/dL (calc) (ref 1.9–3.7)
Glucose, Bld: 90 mg/dL (ref 65–99)
Potassium: 4.7 mmol/L (ref 3.5–5.3)
Sodium: 141 mmol/L (ref 135–146)
Total Bilirubin: 0.5 mg/dL (ref 0.2–1.2)
Total Protein: 6.1 g/dL (ref 6.1–8.1)

## 2020-08-17 ENCOUNTER — Ambulatory Visit: Payer: Medicare Other | Admitting: Internal Medicine

## 2020-09-09 ENCOUNTER — Encounter: Payer: Self-pay | Admitting: Family Medicine

## 2020-09-09 DIAGNOSIS — Z780 Asymptomatic menopausal state: Secondary | ICD-10-CM | POA: Diagnosis not present

## 2020-09-09 DIAGNOSIS — Z1231 Encounter for screening mammogram for malignant neoplasm of breast: Secondary | ICD-10-CM | POA: Diagnosis not present

## 2020-09-09 DIAGNOSIS — M85852 Other specified disorders of bone density and structure, left thigh: Secondary | ICD-10-CM | POA: Diagnosis not present

## 2020-09-09 LAB — HM MAMMOGRAPHY

## 2020-09-21 ENCOUNTER — Encounter: Payer: Self-pay | Admitting: *Deleted

## 2021-06-15 DIAGNOSIS — Z23 Encounter for immunization: Secondary | ICD-10-CM | POA: Diagnosis not present

## 2021-07-07 ENCOUNTER — Telehealth: Payer: Self-pay | Admitting: Family Medicine

## 2021-07-07 NOTE — Telephone Encounter (Signed)
AWV appt scheduled for December. Patient will need med refills beforehand and wants to know if an appointment is needed or if refill requests from the pharmacy will suffice. Please advise at [phone]

## 2021-07-07 NOTE — Telephone Encounter (Signed)
Spoke with pt and advised we can refill any meds she needs prior to her appt, and can also send any other refills at her appt. Pt voiced understanding. Nothing further needed at this time.

## 2021-07-26 ENCOUNTER — Other Ambulatory Visit: Payer: Self-pay | Admitting: Internal Medicine

## 2021-08-19 ENCOUNTER — Encounter: Payer: Self-pay | Admitting: Family Medicine

## 2021-08-19 ENCOUNTER — Ambulatory Visit (INDEPENDENT_AMBULATORY_CARE_PROVIDER_SITE_OTHER): Payer: Medicare Other | Admitting: Family Medicine

## 2021-08-19 ENCOUNTER — Other Ambulatory Visit: Payer: Self-pay

## 2021-08-19 VITALS — BP 138/82 | HR 69 | Temp 98.0°F | Resp 18 | Ht 70.0 in | Wt 198.0 lb

## 2021-08-19 DIAGNOSIS — Z23 Encounter for immunization: Secondary | ICD-10-CM

## 2021-08-19 DIAGNOSIS — I4891 Unspecified atrial fibrillation: Secondary | ICD-10-CM

## 2021-08-19 DIAGNOSIS — E782 Mixed hyperlipidemia: Secondary | ICD-10-CM

## 2021-08-19 DIAGNOSIS — I10 Essential (primary) hypertension: Secondary | ICD-10-CM

## 2021-08-19 DIAGNOSIS — Z0000 Encounter for general adult medical examination without abnormal findings: Secondary | ICD-10-CM

## 2021-08-19 DIAGNOSIS — Z0001 Encounter for general adult medical examination with abnormal findings: Secondary | ICD-10-CM

## 2021-08-19 DIAGNOSIS — M8580 Other specified disorders of bone density and structure, unspecified site: Secondary | ICD-10-CM

## 2021-08-19 MED ORDER — METOPROLOL TARTRATE 25 MG PO TABS
25.0000 mg | ORAL_TABLET | Freq: Two times a day (BID) | ORAL | 3 refills | Status: DC
Start: 2021-08-19 — End: 2022-08-31

## 2021-08-19 NOTE — Progress Notes (Signed)
Subjective:    Patient ID: Paige Martinez, female    DOB: 11/11/1945, 75 y.o.   MRN: [id number]  HPI Patient is a very pleasant 75 year old Caucasian female here today for complete physical exam.  Her mammogram was performed in January of last year and was normal.  She is due for a mammogram in January 20.  Due to her age she does not require Pap smear.  Her colonoscopy was performed in 2020 and had 1 tubular adenoma.  She is due again in 2025.  She had a bone density test this year that showed bone osteopenia.  I will repeat that again in 2020 follow-up.  She is on calcium and vitamin D.  She is due for the shingles vaccine, the most recent COVID booster, and a booster on her Pneumovax 23.  She would like Pneumovax 23 today.  Otherwise she is doing well with no concerns.  Specifically she denies any chest pain palpitation shortness of breath or tachycardia.  She denies any falls or depression or memory loss Immunization History  Administered Date(s) Administered   Influenza Whole 05/29/2013   Influenza, High Dose Seasonal PF 06/22/2018, 05/31/2019   Influenza,inj,Quad PF,6+ Mos 06/05/2014, 06/11/2015, 06/16/2016, 06/21/2017   Influenza-Unspecified 06/19/2020, 06/15/2021   Moderna Sars-Covid-2 Vaccination 10/10/2019, 11/08/2019, 06/24/2020   Pneumococcal Conjugate-13 06/05/2014   Pneumococcal Polysaccharide-23 04/17/2009   Tdap 06/27/2013   Zoster, Live 06/10/2011   She denies any memory loss, falls, or depression Past Medical History:  Diagnosis Date   Hypertension    Venereal warts 08/29/2000   Vitamin D deficiency 04/18/2008   Past Surgical History:  Procedure Laterality Date   TONSILLECTOMY     TUBAL LIGATION     Current Outpatient Medications on File Prior to Visit  Medication Sig Dispense Refill   Calcium Citrate-Vitamin D (CALCIUM + D PO) Take 1 tablet by mouth daily.      Cholecalciferol (VITAMIN D) 50 MCG (2000 UT) tablet Take 2,000 Units by mouth daily.     ibuprofen  (ADVIL) 200 MG tablet Take 200 mg by mouth daily as needed for headache or moderate pain.     lisinopril (PRINIVIL,ZESTRIL) 5 MG tablet Take 1 tablet (5 mg total) by mouth daily. (Patient taking differently: Take 5 mg by mouth at bedtime. ) 90 tablet 3   Multiple Vitamin (MULTIVITAMIN) tablet Take 1 tablet by mouth daily at 12 noon.      No current facility-administered medications on file prior to visit.    No Known Allergies Social History   Socioeconomic History   Marital status: Divorced    Spouse name: Not on file   Number of children: Not on file   Years of education: Not on file   Highest education level: Not on file  Occupational History   Occupation: Probation officer Dept  Social Needs   Financial resource strain: Not on file   Food insecurity    Worry: Not on file    Inability: Not on file   Transportation needs    Medical: Not on file    Non-medical: Not on file  Tobacco Use   Smoking status: Never Smoker   Smokeless tobacco: Never Used  Substance and Sexual Activity   Alcohol use: No   Drug use: No   Sexual activity: Not on file  Lifestyle   Physical activity    Days per week: Not on file    Minutes per session: Not on file   Stress: Not on file  Relationships  Social Herbalist on phone: Not on file    Gets together: Not on file    Attends religious service: Not on file    Active member of club or organization: Not on file    Attends meetings of clubs or organizations: Not on file    Relationship status: Not on file   Intimate partner violence    Fear of current or ex partner: Not on file    Emotionally abused: Not on file    Physically abused: Not on file    Forced sexual activity: Not on file  Other Topics Concern   Not on file  Social History Narrative   Divorced.    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   Rides Stationary Bike 4 miles 4 days per week. --20 minutes      Review of  Systems  All other systems reviewed and are negative.      Objective:   Physical Exam Vitals signs reviewed.  Constitutional:      Appearance: Normal appearance. She is normal weight.  Cardiovascular:     Rate and Rhythm: Normal rate and regular rhythm.     Heart sounds: Normal heart sounds. No murmur.  Pulmonary:     Effort: Pulmonary effort is normal. No respiratory distress.     Breath sounds: Normal breath sounds. No stridor. No wheezing or rhonchi.  Musculoskeletal:     Right lower leg: No edema.     Left lower leg: No edema.  Neurological:     Mental Status: She is alert.   Abdominal exam is normal with no tenderness to palpation, distention, with normal bowel sounds.  There is no thyromegaly on exam or lymphadenopathy.  There is no skin rash or abnormal moles seen on examination of her back, forehead, face, or arms.  Neurologic exam is normal with normal reflexes.  Cranial nerves II through XII are grossly intact with muscle strength 5/5 equal and symmetric in the upper and lower extremities.  TMs and canals are clear on examination.  There is no erythema or exudate in the posterior oropharynx.  Conjunctiva appear normal.        Assessment & Plan:  Primary hypertension - Plan: CBC with Differential/Platelet, Lipid panel, COMPLETE METABOLIC PANEL WITH GFR  Mixed hyperlipidemia  General medical exam  Atrial fibrillation, unspecified type ([place])  Osteopenia, unspecified location Blood pressures today is well controlled on metoprolol.  Patient is in normal sinus rhythm and heart rate is well controlled.  After the ablation her cardiologist recommended a full dose aspirin.  Immunizations are up-to-date except for Pneumovax 23 which she received today.  I did recommend a booster on the COVID shot as well as Shingrix.  Colonoscopy is due in 2025.  Mammogram is due this coming January.  Bone density is due in 2025.  I will check CBC CMP fasting lipid panel.  Patient denies any  falls, depression, or memory loss

## 2021-08-19 NOTE — Addendum Note (Signed)
Addended by: Wadie Lessen on: 08/19/2021 10:58 AM   Modules accepted: Orders

## 2021-09-14 DIAGNOSIS — Z1231 Encounter for screening mammogram for malignant neoplasm of breast: Secondary | ICD-10-CM | POA: Diagnosis not present

## 2021-09-14 LAB — HM MAMMOGRAPHY

## 2021-10-29 DIAGNOSIS — Z20822 Contact with and (suspected) exposure to covid-19: Secondary | ICD-10-CM | POA: Diagnosis not present

## 2021-11-12 DIAGNOSIS — Z20828 Contact with and (suspected) exposure to other viral communicable diseases: Secondary | ICD-10-CM | POA: Diagnosis not present

## 2021-11-17 DIAGNOSIS — Z20822 Contact with and (suspected) exposure to covid-19: Secondary | ICD-10-CM | POA: Diagnosis not present

## 2021-11-27 DIAGNOSIS — Z20828 Contact with and (suspected) exposure to other viral communicable diseases: Secondary | ICD-10-CM | POA: Diagnosis not present

## 2021-12-09 ENCOUNTER — Ambulatory Visit (INDEPENDENT_AMBULATORY_CARE_PROVIDER_SITE_OTHER): Payer: Medicare Other | Admitting: Family Medicine

## 2021-12-09 ENCOUNTER — Telehealth: Payer: Self-pay | Admitting: Family Medicine

## 2021-12-09 VITALS — BP 130/86 | HR 64 | Temp 97.2°F | Ht 70.0 in | Wt 191.4 lb

## 2021-12-09 DIAGNOSIS — M109 Gout, unspecified: Secondary | ICD-10-CM | POA: Diagnosis not present

## 2021-12-09 MED ORDER — COLCHICINE 0.6 MG PO TABS: ORAL_TABLET | ORAL | 0 refills | Status: DC

## 2021-12-09 NOTE — Progress Notes (Signed)
? ?Subjective:  ? ? Patient ID: Paige Martinez, female    DOB: November 14, 1945, 76 y.o.   MRN: [id number] ? ?Foot Pain ? ?Patient is a very sweet 76 year old Caucasian female who in the last 8 weeks has had 2 different attacks that she believes may have been gout.  Both attacks are in her feet.  The most recent attack was in her right first MTP joint.  She states that it occurred suddenly without provocation.  The joint became swollen and hot and extremely painful.  She did not even want the sheet to touch it.  After a week, the pain has gradually subsided and is now essentially gone.  She states that she has never been diagnosed with gout before ?Past Medical History:  ?Diagnosis Date  ? Atrial flutter ([place])   ? Hypertension   ? Paroxysmal atrial fibrillation ([facility])   ? Venereal warts 08/29/2000  ? Vitamin D deficiency 04/18/2008  ? ?Past Surgical History:  ?Procedure Laterality Date  ? ATRIAL FIBRILLATION ABLATION N/A 02/11/2020  ? Procedure: ATRIAL FIBRILLATION ABLATION;  Surgeon: Thompson Grayer, MD;  Location: [facility];  Service: Cardiovascular;  Laterality: N/A;  ? CATARACT EXTRACTION W/PHACO Left 03/11/2019  ? Procedure: CATARACT EXTRACTION PHACO AND INTRAOCULAR LENS PLACEMENT LEFT EYE;  Surgeon: Baruch Goldmann, MD;  Location: [facility];  Service: Ophthalmology;  Laterality: Left;  CDE: 11.60  ? COLONOSCOPY N/A 07/09/2019  ? Procedure: COLONOSCOPY;  Surgeon: Daneil Dolin, MD;  [facility];  Service: Endoscopy;  Laterality: N/A;  10:30AM  ? POLYPECTOMY  07/09/2019  ? Procedure: POLYPECTOMY;  Surgeon: Daneil Dolin, MD;  [facility];  Service: Endoscopy;;  ? TONSILLECTOMY    ? TUBAL LIGATION    ? ?Current Outpatient Medications on File Prior to Visit  ?Medication Sig Dispense Refill  ? aspirin 325 MG tablet Take 325 mg by mouth daily.    ? Calcium Citrate-Vitamin D (CALCIUM + D PO) Take 1 tablet by mouth daily.     ? Cholecalciferol (VITAMIN D) 50 MCG (2000 UT) tablet Take 2,000 Units by  mouth daily.    ? ibuprofen (ADVIL) 200 MG tablet Take 200 mg by mouth daily as needed for headache or moderate pain.    ? metoprolol tartrate (LOPRESSOR) 25 MG tablet Take 1 tablet (25 mg total) by mouth 2 (two) times daily. 180 tablet 3  ? Multiple Vitamin (MULTIVITAMIN) tablet Take 1 tablet by mouth at bedtime.     ? Probiotic CAPS Take 2 capsules by mouth at bedtime.    ? pantoprazole (PROTONIX) 40 MG tablet Take 1 tablet (40 mg total) by mouth daily. 45 tablet 0  ? ?No current facility-administered medications on file prior to visit.  ? ?No Known Allergies ?Social History  ? ?Socioeconomic History  ? Marital status: Divorced  ?  Spouse name: Not on file  ? Number of children: Not on file  ? Years of education: Not on file  ? Highest education level: Not on file  ?Occupational History  ? 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 Smoking status: Never  ? Smokeless tobacco: Never  ?Vaping Use  ? Vaping Use: Never used  ?Substance and Sexual Activity  ? Alcohol use: No  ? Drug use: No  ? Sexual activity: Not on file  ?Other Topics Concern  ? Not on file  ?Social History Narrative  ? Divorced and lives in [place]  ? One Child--Son (age 55 at 40 OV)  ?  Retired- previously worked 67 years in [employer]  ? Rides Stationary Bike 4 miles 4 days per week. --20 minutes  ? ?Social Determinants of Health  ? ?Financial Resource Strain: Not on file  ?Food Insecurity: Not on file  ?Transportation Needs: Not on file  ?Physical Activity: Not on file  ?Stress: Not on file  ?Social Connections: Not on file  ?Intimate Partner Violence: Not on file  ? ? ? ? ?Review of Systems  ?All other systems reviewed and are negative. ? ?   ?Objective:  ? Physical Exam ?Vitals reviewed.  ?Constitutional:   ?   Appearance: Normal appearance. She is normal weight.  ?Cardiovascular:  ?   Rate and Rhythm: Normal rate and regular rhythm.  ?   Pulses:     ?     Dorsalis pedis pulses are 2+ on the right side.  ?     Posterior tibial pulses are 2+  on the right side.  ?   Heart sounds: Normal heart sounds. No murmur heard. ?Pulmonary:  ?   Effort: Pulmonary effort is normal. No respiratory distress.  ?   Breath sounds: Normal breath sounds. No stridor. No wheezing or rhonchi.  ?Musculoskeletal:  ?   Right lower leg: No edema.  ?   Left lower leg: No edema.  ?   Right foot: Normal range of motion. No deformity or bunion.  ?Feet:  ?   Right foot:  ?   Skin integrity: Skin integrity normal.  ?Neurological:  ?   Mental Status: She is alert.  ? ? ? ? ? ?   ?Assessment & Plan:  ?Podagra - Plan: Uric acid, BASIC METABOLIC PANEL WITH GFR, colchicine 0.6 MG tablet ?Symptoms sound consistent with podagra.  We discussed gout.  I will check a uric acid level.  If infrequent, she can use colchicine 0.6 mg tablets, 2 tablets at the first sign of a gout attack followed by 1 tablet 1 hour later.  If this occurs sporadically I believe this would be a good option to treat.  If they begin to occur frequently, she could take allopurinol to try to drop her uric acid level less than 6 to help prevent gout attacks in the future.  At the present time she is little work on her diet and use the colchicine only as needed if labs confirm elevated uric acid level ?

## 2021-12-09 NOTE — Telephone Encounter (Signed)
Received eFax from pharmacy to request clarification of refill for colchicine 0.6 MG tablet ? ? ?Efax received from ? ?[facility] [place] [address]  [address] [place], [place] [address]  ?Phone:  [phone]  Fax:  [phone]  ? ?Please advise pharmacist.  ?

## 2021-12-10 ENCOUNTER — Encounter: Payer: Self-pay | Admitting: Family Medicine

## 2021-12-10 DIAGNOSIS — M109 Gout, unspecified: Secondary | ICD-10-CM | POA: Insufficient documentation

## 2021-12-10 LAB — BASIC METABOLIC PANEL WITH GFR
BUN: 20 mg/dL (ref 7–25)
CO2: 26 mmol/L (ref 20–32)
Calcium: 9.7 mg/dL (ref 8.6–10.4)
Chloride: 108 mmol/L (ref 98–110)
Creat: 0.87 mg/dL (ref 0.60–1.00)
Glucose, Bld: 98 mg/dL (ref 65–99)
Potassium: 5 mmol/L (ref 3.5–5.3)
Sodium: 144 mmol/L (ref 135–146)
eGFR: 69 mL/min/{1.73_m2} (ref >=60)

## 2021-12-10 LAB — URIC ACID: Uric Acid, Serum: 7.5 mg/dL — ABNORMAL HIGH (ref 2.5–7.0)

## 2021-12-10 MED ORDER — COLCHICINE 0.6 MG PO TABS: ORAL_TABLET | ORAL | 0 refills | Status: AC

## 2021-12-10 NOTE — Telephone Encounter (Signed)
Rx sent to pharmacy   

## 2021-12-13 DIAGNOSIS — Z20822 Contact with and (suspected) exposure to covid-19: Secondary | ICD-10-CM | POA: Diagnosis not present

## 2021-12-30 DIAGNOSIS — Z20822 Contact with and (suspected) exposure to covid-19: Secondary | ICD-10-CM | POA: Diagnosis not present

## 2022-01-03 DIAGNOSIS — Z20822 Contact with and (suspected) exposure to covid-19: Secondary | ICD-10-CM | POA: Diagnosis not present

## 2022-05-30 DIAGNOSIS — Z23 Encounter for immunization: Secondary | ICD-10-CM | POA: Diagnosis not present

## 2022-08-17 ENCOUNTER — Telehealth: Payer: Self-pay | Admitting: Family Medicine

## 2022-08-17 NOTE — Telephone Encounter (Signed)
Left message to return call; need to reschedule Medicare AWV appt currently on the calendar for 08/24/22; Nurse will be out of the office.

## 2022-08-31 ENCOUNTER — Other Ambulatory Visit: Payer: Self-pay | Admitting: Family Medicine

## 2022-09-07 ENCOUNTER — Ambulatory Visit (INDEPENDENT_AMBULATORY_CARE_PROVIDER_SITE_OTHER): Payer: Medicare Other

## 2022-09-07 VITALS — BP 130/70 | HR 64 | Ht 70.0 in | Wt 198.0 lb

## 2022-09-07 DIAGNOSIS — Z0000 Encounter for general adult medical examination without abnormal findings: Secondary | ICD-10-CM

## 2022-09-07 NOTE — Progress Notes (Signed)
Subjective:   Paige Martinez is a 77 y.o. female who presents for Medicare Annual (Subsequent) preventive examination. IN OFFICE VISIT.  Review of Systems     Cardiac Risk Factors include: advanced age (>39mn, >>14women);hypertension;dyslipidemia;sedentary lifestyle     Objective:    Today's Vitals   09/07/22 1526  BP: 130/70  Pulse: 64  SpO2: 99%  Weight: 198 lb (89.8 kg)  Height: '5\' 10"'$  (1.778 m)   Body mass index is 28.41 kg/m.     09/07/2022    3:38 PM 08/19/2021   10:26 AM 07/10/2020    9:18 AM 02/11/2020    6:11 AM 07/09/2019    9:39 AM 03/25/2019    9:17 AM 03/11/2019   10:31 AM  Advanced Directives  Does Patient Have a Medical Advance Directive? Yes No No No No No No  Type of [place] will        Copy of [place]? No - copy requested        Would patient like information on creating a medical advance directive?    No - Patient declined No - Patient declined No - Patient declined No - Patient declined    Current Medications (verified) Outpatient Encounter Medications as of 09/07/2022  Medication Sig   aspirin 325 MG tablet Take 325 mg by mouth daily.   Calcium Citrate-Vitamin D (CALCIUM + D PO) Take 1 tablet by mouth daily.    Cholecalciferol (VITAMIN D) 50 MCG (2000 UT) tablet Take 2,000 Units by mouth daily.   colchicine 0.6 MG tablet Take 2 pills immediately and 1 more in 1 hour as needed for gout attack.   ibuprofen (ADVIL) 200 MG tablet Take 200 mg by mouth daily as needed for headache or moderate pain.   metoprolol tartrate (LOPRESSOR) 25 MG tablet Take 1 tablet by mouth twice daily   Multiple Vitamin (MULTIVITAMIN) tablet Take 1 tablet by mouth at bedtime.    Probiotic CAPS Take 2 capsules by mouth at bedtime.   pantoprazole (PROTONIX) 40 MG tablet Take 1 tablet (40 mg total) by mouth daily.   No facility-administered encounter medications on file as of 09/07/2022.     Allergies (verified) Patient has no known allergies.   History: Past Medical History:  Diagnosis Date   Atrial flutter ([place]    Gout    Hypertension    Paroxysmal atrial fibrillation ([place]    Venereal warts 08/29/2000   Vitamin D deficiency 04/18/2008   Past Surgical History:  Procedure Laterality Date   ATRIAL FIBRILLATION ABLATION N/A 02/11/2020   Procedure: ATRIAL FIBRILLATION ABLATION;  Surgeon: AThompson Grayer MD;  [facility];  Service: Cardiovascular;  Laterality: N/A;   CATARACT EXTRACTION W/PHACO Left 03/11/2019   Procedure: CATARACT EXTRACTION PHACO AND INTRAOCULAR LENS PLACEMENT LEFT EYE;  Surgeon: WBaruch Goldmann MD;  Location: [facility];  Service: Ophthalmology;  Laterality: Left;  CDE: 11.60   COLONOSCOPY N/A 07/09/2019   Procedure: COLONOSCOPY;  Surgeon: RDaneil Dolin MD;  [facility];  Service: Endoscopy;  Laterality: N/A;  10:30AM   POLYPECTOMY  07/09/2019   Procedure: POLYPECTOMY;  Surgeon: RDaneil Dolin MD;  [facility];  Service: Endoscopy;;   TONSILLECTOMY     TUBAL LIGATION     Family History  Problem Relation Age of Onset   Alcohol abuse Father    Cancer Father 627      Pancreatic Cancer   Hypertension  Sister    Hypertension Brother    Colon cancer Paternal Grandmother        greater than 40 years old   Social History   Socioeconomic History   Marital status: Divorced    Spouse name: Not on file   Number of children: Not on file   Years of education: Not on file   Highest education level: Not on file  Occupational History   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 --20 minutes   Social Determinants of Health   Financial Resource Strain: Low Risk  (09/07/2022)   Overall Financial Resource Strain (CARDIA)    Difficulty of Paying Living Expenses: Not hard at all  Food Insecurity: No Food Insecurity (09/07/2022)   Hunger Vital Sign    Worried About Running Out of Food in the Last Year: Never true    Ran Out of Food in the Last Year: Never true  Transportation Needs: No Transportation Needs (09/07/2022)   PRAPARE - Hydrologist (Medical): No    Lack of Transportation (Non-Medical): No  Physical Activity: Insufficiently Active (09/07/2022)   Exercise Vital Sign    Days of Exercise per Week: 3 days    Minutes of Exercise per Session: 30 min  Stress: No Stress Concern Present (09/07/2022)   [place]    Feeling of Stress : Not at all  Social Connections: Socially Isolated (09/07/2022)   Social Connection and Isolation Panel [NHANES]    Frequency of Communication with Friends and Family: More than three times a week    Frequency of Social Gatherings with Friends and Family: More than three times a week    Attends Religious Services: Never    Marine scientist or Organizations: No    Attends Music therapist: Never    Marital Status: Divorced    Tobacco Counseling Counseling given: Not Answered   Clinical Intake:  Pre-visit preparation completed: Yes  Pain : No/denies pain     BMI - recorded: 28.41 Nutritional Status: BMI 25 -29 Overweight Nutritional Risks: None Diabetes: No  How often do you need to have someone help you when you read instructions, pamphlets, or other written materials from your doctor or pharmacy?: 1 - Never  Diabetic?NO  Interpreter Needed?: No  Information entered by :: mj Briget Shaheed, lpn   Activities of Daily  Living    09/07/2022    3:38 PM  In your present state of health, do you have any difficulty performing the following activities:  Hearing? 0  Vision? 0  Difficulty concentrating or making decisions? 0  Walking or climbing stairs? 0  Dressing or bathing? 0  Doing errands, shopping? 0  Preparing Food and eating ? N  Using the Toilet? N  In the past six months, have you accidently leaked urine? N  Do you have problems with loss of bowel control? N  Managing your Medications? N  Managing your Finances? N  Housekeeping or managing your Housekeeping? N    Patient Care Team: Susy Frizzle, MD as PCP - General (Family Medicine) Gala Romney Cristopher Estimable, MD as Consulting Physician (Gastroenterology)  Indicate any recent Medical Services you may have received from other than [facility] providers in the past year (date may be approximate).     Assessment:   This is a routine wellness examination for [place].  Hearing/Vision screen Hearing Screening - Comments:: No hearing issues. Vision Screening - Comments:: Readers. 2020. [place] issues and exercise activities discussed: Current Exercise Habits: Home exercise routine, Type of exercise: walking, Time (Minutes): 30, Frequency (Times/Week): 3, Weekly Exercise (Minutes/Week): 90, Intensity: Mild, Exercise limited by: cardiac condition(s)   Goals Addressed             This Visit's Progress    Exercise 3x per week (30 min per time)         Depression Screen    09/07/2022    3:33 PM 08/19/2021   10:26 AM 07/10/2020    9:18 AM 07/12/2018    8:41 AM 06/21/2017    8:18 AM 06/16/2016    8:35 AM 06/11/2015    9:04 AM  PHQ 2/9 Scores  PHQ - 2 Score 0 0 0 2 2 0 0  PHQ- 9 Score    6 7      Fall Risk    09/07/2022    3:38 PM 08/19/2021   10:26 AM 07/10/2020    9:18 AM 07/12/2018    8:48 AM 07/12/2018    8:41 AM  Fall Risk   Falls in the past year? 0 0 0 1 0  Number falls in past yr: 0 0 0 0   Injury with Fall? 0 0 0 0    Risk for fall due to : No Fall Risks   History of fall(s)   Follow up Falls prevention discussed   Falls evaluation completed Falls evaluation completed    FALL RISK PREVENTION PERTAINING TO THE HOME:  Any stairs in or around the home? Yes  If so, are there any without handrails? No  Home free of loose throw rugs in walkways, pet beds, electrical cords, etc? Yes  Adequate lighting in your home to reduce risk of falls? Yes   ASSISTIVE DEVICES UTILIZED TO PREVENT FALLS:  Life alert? No  Use of a cane, walker or w/c? No  Grab bars in the bathroom? No  Shower chair or bench in shower? Yes  Elevated toilet seat or a handicapped toilet? No   TIMED UP AND GO:  Was the test performed? Yes .  Length of time to ambulate 10 feet: 12 sec.   Gait steady and fast without use of assistive device  Cognitive Function:        09/07/2022    3:41 PM 07/10/2020    9:19 AM  6CIT Screen  What Year? 0 points 0 points  What month? 0 points 0 points  What time? 0 points 0 points  Count back from 20 0 points 0 points  Months in reverse 0 points 0 points  Repeat phrase 0 points 0 points  Total Score 0 points 0 points    Immunizations Immunization History  Administered Date(s) Administered   Influenza Whole 05/29/2013   Influenza, High Dose Seasonal PF 06/22/2018, 05/31/2019   Influenza,inj,Quad PF,6+ Mos 06/05/2014, 06/11/2015, 06/16/2016, 06/21/2017   Influenza-Unspecified 06/19/2020, 06/15/2021, 05/30/2022   Moderna Sars-Covid-2 Vaccination 10/10/2019, 11/08/2019, 06/24/2020   Pneumococcal  Conjugate-13 06/05/2014   Pneumococcal Polysaccharide-23 04/17/2009, 08/19/2021   Rsv, Bivalent, Protein Subunit Rsvpref,pf Evans Lance) 06/09/2022   Tdap 06/27/2013   Zoster, Live 06/10/2011    TDAP status: Up to date  Flu Vaccine status: Up to date  Pneumococcal vaccine status: Up to date  Covid-19 vaccine status: Completed vaccines  Qualifies for Shingles Vaccine? Yes   Zostavax completed  Yes   Shingrix Completed?: No.    Education has been provided regarding the importance of this vaccine. Patient has been advised to call insurance company to determine out of pocket expense if they have not yet received this vaccine. Advised may also receive vaccine at local pharmacy or Health Dept. Verbalized acceptance and understanding.  Screening Tests Health Maintenance  Topic Date Due   COVID-19 Vaccine (4 - 2023-24 season) 12/27/2022 (Originally 04/29/2022)   Zoster Vaccines- Shingrix (1 of 2) 01/30/2023 (Originally 12/03/1964)   DTaP/Tdap/Td (2 - Td or Tdap) 06/28/2023   Medicare Annual Wellness (AWV)  09/08/2023   Pneumonia Vaccine 55+ Years old  Completed   INFLUENZA VACCINE  Completed   DEXA SCAN  Completed   Hepatitis C Screening  Completed   HPV VACCINES  Aged Out   COLONOSCOPY (Pts 45-48yr Insurance coverage will need to be confirmed)  Discontinued    Health Maintenance  There are no preventive care reminders to display for this patient.   Colorectal cancer screening: Type of screening: Colonoscopy. Completed 07/09/2019. Repeat every No longer required  years  Mammogram status: Completed 09/14/2021. Repeat every year  Bone Density status: Completed 09/09/2020. Results reflect: Bone density results: NORMAL. Repeat every 2 years.  Lung Cancer Screening: (Low Dose CT Chest recommended if Age 337-80years, 30 pack-year currently smoking OR have quit w/in 15years.) does not qualify.   Lung Cancer Screening Referral: N/A  Additional Screening:  Hepatitis C Screening: does qualify; Completed 06/21/2017  Vision Screening: Recommended annual ophthalmology exams for early detection of glaucoma and other disorders of the eye. Is the patient up to date with their annual eye exam?  No  Who is the provider or what is the name of the office in which the patient attends annual eye exams? Dr. JBaruch GoldmannIf pt is not established with a provider, would they like to be referred to a  provider to establish care? No .   Dental Screening: Recommended annual dental exams for proper oral hygiene  Community Resource Referral / Chronic Care Management: CRR required this visit?  No   CCM required this visit?  No      Plan:     I have personally reviewed and noted the following in the patient's chart:   Medical and social history Use of alcohol, tobacco or illicit drugs  Current medications and supplements including opioid prescriptions. Patient is not currently taking opioid prescriptions. Functional ability and status Nutritional status Physical activity Advanced directives List of other physicians Hospitalizations, surgeries, and ER visits in previous 12 months Vitals Screenings to include cognitive, depression, and falls Referrals and appointments  In addition, I have reviewed and discussed with patient certain preventive protocols, quality metrics, and best practice recommendations. A written personalized care plan for preventive services as well as general preventive health recommendations were provided to patient.     MChriss Driver LPN   [id number]  Nurse Notes: Discussed Shingrix, Tdap and Covid vaccines and how to obtain.

## 2022-09-07 NOTE — Patient Instructions (Signed)
Paige Martinez , Thank you for taking time to come for your Medicare Wellness Visit. I appreciate your ongoing commitment to your health goals. Please review the following plan we discussed and let me know if I can assist you in the future.   These are the goals we discussed:  Goals      Exercise 3x per week (30 min per time)        This is a list of the screening recommended for you and due dates:  Health Maintenance  Topic Date Due   COVID-19 Vaccine (4 - 2023-24 season) 12/27/2022*   Zoster (Shingles) Vaccine (1 of 2) 01/30/2023*   DTaP/Tdap/Td vaccine (2 - Td or Tdap) 06/28/2023   Medicare Annual Wellness Visit  09/08/2023   Pneumonia Vaccine  Completed   Flu Shot  Completed   DEXA scan (bone density measurement)  Completed   Hepatitis C Screening: USPSTF Recommendation to screen - Ages 42-79 yo.  Completed   HPV Vaccine  Aged Out   Colon Cancer Screening  Discontinued  *Topic was postponed. The date shown is not the original due date.    Advanced directives: Please bring a copy of your health care power of attorney and living will to the office to be added to your chart at your convenience.   Conditions/risks identified: Aim for 30 minutes of exercise or brisk walking, 6-8 glasses of water, and 5 servings of fruits and vegetables each day.   Next appointment: Follow up in one year for your annual wellness visit 08/2023.   Preventive Care 7 Years and Older, Female Preventive care refers to lifestyle choices and visits with your health care provider that can promote health and wellness. What does preventive care include? A yearly physical exam. This is also called an annual well check. Dental exams once or twice a year. Routine eye exams. Ask your health care provider how often you should have your eyes checked. Personal lifestyle choices, including: Daily care of your teeth and gums. Regular physical activity. Eating a healthy diet. Avoiding tobacco and drug  use. Limiting alcohol use. Practicing safe sex. Taking low-dose aspirin every day. Taking vitamin and mineral supplements as recommended by your health care provider. What happens during an annual well check? The services and screenings done by your health care provider during your annual well check will depend on your age, overall health, lifestyle risk factors, and family history of disease. Counseling  Your health care provider may ask you questions about your: Alcohol use. Tobacco use. Drug use. Emotional well-being. Home and relationship well-being. Sexual activity. Eating habits. History of falls. Memory and ability to understand (cognition). Work and work Statistician. Reproductive health. Screening  You may have the following tests or measurements: Height, weight, and BMI. Blood pressure. Lipid and cholesterol levels. These may be checked every 5 years, or more frequently if you are over 93 years old. Skin check. Lung cancer screening. You may have this screening every year starting at age 66 if you have a 30-pack-year history of smoking and currently smoke or have quit within the past 15 years. Fecal occult blood test (FOBT) of the stool. You may have this test every year starting at age 67. Flexible sigmoidoscopy or colonoscopy. You may have a sigmoidoscopy every 5 years or a colonoscopy every 10 years starting at age 90. Hepatitis C blood test. Hepatitis B blood test. Sexually transmitted disease (STD) testing. Diabetes screening. This is done by checking your blood sugar (glucose) after you have not  eaten for a while (fasting). You may have this done every 1-3 years. Bone density scan. This is done to screen for osteoporosis. You may have this done starting at age 106. Mammogram. This may be done every 1-2 years. Talk to your health care provider about how often you should have regular mammograms. Talk with your health care provider about your test results, treatment  options, and if necessary, the need for more tests. Vaccines  Your health care provider may recommend certain vaccines, such as: Influenza vaccine. This is recommended every year. Tetanus, diphtheria, and acellular pertussis (Tdap, Td) vaccine. You may need a Td booster every 10 years. Zoster vaccine. You may need this after age 65. Pneumococcal 13-valent conjugate (PCV13) vaccine. One dose is recommended after age 36. Pneumococcal polysaccharide (PPSV23) vaccine. One dose is recommended after age 16. Talk to your health care provider about which screenings and vaccines you need and how often you need them. This information is not intended to replace advice given to you by your health care provider. Make sure you discuss any questions you have with your health care provider. Document Released: 09/11/2015 Document Revised: 05/04/2016 Document Reviewed: 06/16/2015 Elsevier Interactive Patient Education  2017 [employer] [employer] Prevention in the Home Falls can cause injuries. They can happen to people of all ages. There are many things you can do to make your home safe and to help prevent falls. What can I do on the outside of my home? Regularly fix the edges of walkways and driveways and fix any cracks. Remove anything that might make you trip as you walk through a door, such as a raised step or threshold. Trim any bushes or trees on the path to your home. Use bright outdoor lighting. Clear any walking paths of anything that might make someone trip, such as rocks or tools. Regularly check to see if handrails are loose or broken. Make sure that both sides of any steps have handrails. Any raised decks and porches should have guardrails on the edges. Have any leaves, snow, or ice cleared regularly. Use sand or salt on walking paths during winter. Clean up any spills in your garage right away. This includes oil or grease spills. What can I do in the bathroom? Use night lights. Install grab  bars by the toilet and in the tub and shower. Do not use towel bars as grab bars. Use non-skid mats or decals in the tub or shower. If you need to sit down in the shower, use a plastic, non-slip stool. Keep the floor dry. Clean up any water that spills on the floor as soon as it happens. Remove soap buildup in the tub or shower regularly. Attach bath mats securely with double-sided non-slip rug tape. Do not have throw rugs and other things on the floor that can make you trip. What can I do in the bedroom? Use night lights. Make sure that you have a light by your bed that is easy to reach. Do not use any sheets or blankets that are too big for your bed. They should not hang down onto the floor. Have a firm chair that has side arms. You can use this for support while you get dressed. Do not have throw rugs and other things on the floor that can make you trip. What can I do in the kitchen? Clean up any spills right away. Avoid walking on wet floors. Keep items that you use a lot in easy-to-reach places. If you need to reach  something above you, use a strong step stool that has a grab bar. Keep electrical cords out of the way. Do not use floor polish or wax that makes floors slippery. If you must use wax, use non-skid floor wax. Do not have throw rugs and other things on the floor that can make you trip. What can I do with my stairs? Do not leave any items on the stairs. Make sure that there are handrails on both sides of the stairs and use them. Fix handrails that are broken or loose. Make sure that handrails are as long as the stairways. Check any carpeting to make sure that it is firmly attached to the stairs. Fix any carpet that is loose or worn. Avoid having throw rugs at the top or bottom of the stairs. If you do have throw rugs, attach them to the floor with carpet tape. Make sure that you have a light switch at the top of the stairs and the bottom of the stairs. If you do not have them,  ask someone to add them for you. What else can I do to help prevent falls? Wear shoes that: Do not have high heels. Have rubber bottoms. Are comfortable and fit you well. Are closed at the toe. Do not wear sandals. If you use a stepladder: Make sure that it is fully opened. Do not climb a closed stepladder. Make sure that both sides of the stepladder are locked into place. Ask someone to hold it for you, if possible. Clearly mark and make sure that you can see: Any grab bars or handrails. First and last steps. Where the edge of each step is. Use tools that help you move around (mobility aids) if they are needed. These include: Canes. Walkers. Scooters. Crutches. Turn on the lights when you go into a dark area. Replace any light bulbs as soon as they burn out. Set up your furniture so you have a clear path. Avoid moving your furniture around. If any of your floors are uneven, fix them. If there are any pets around you, be aware of where they are. Review your medicines with your doctor. Some medicines can make you feel dizzy. This can increase your chance of falling. Ask your doctor what other things that you can do to help prevent falls. This information is not intended to replace advice given to you by your health care provider. Make sure you discuss any questions you have with your health care provider. Document Released: 06/11/2009 Document Revised: 01/21/2016 Document Reviewed: 09/19/2014 Elsevier Interactive Patient Education  2017 [employer].

## 2022-10-06 ENCOUNTER — Encounter (HOSPITAL_COMMUNITY): Payer: Self-pay | Admitting: *Deleted

## 2022-10-19 ENCOUNTER — Other Ambulatory Visit: Payer: Self-pay | Admitting: Family Medicine

## 2022-10-25 NOTE — Telephone Encounter (Signed)
Patient called to follow up on refill request for   metoprolol tartrate (LOPRESSOR) 25 MG tablet   Pharmacy confirmed as  [facility] [address] [place] [address] [place] [address] [place], [place] [address] Phone: [phone]  Fax: [phone]   Patient only has 3 pills left.  Please advise at [phone]

## 2022-10-26 ENCOUNTER — Other Ambulatory Visit: Payer: Self-pay

## 2022-10-26 ENCOUNTER — Other Ambulatory Visit: Payer: Self-pay | Admitting: Family Medicine

## 2022-10-26 DIAGNOSIS — I10 Essential (primary) hypertension: Secondary | ICD-10-CM

## 2022-10-26 MED ORDER — METOPROLOL TARTRATE 25 MG PO TABS: 25.0000 mg | ORAL_TABLET | Freq: Two times a day (BID) | ORAL | 1 refills | Status: DC

## 2022-12-23 ENCOUNTER — Encounter: Payer: Self-pay | Admitting: Family Medicine

## 2022-12-23 ENCOUNTER — Ambulatory Visit (INDEPENDENT_AMBULATORY_CARE_PROVIDER_SITE_OTHER): Payer: Medicare Other | Admitting: Family Medicine

## 2022-12-23 VITALS — BP 146/82 | HR 77 | Temp 97.9°F | Ht 70.0 in | Wt 194.0 lb

## 2022-12-23 DIAGNOSIS — I4891 Unspecified atrial fibrillation: Secondary | ICD-10-CM | POA: Diagnosis not present

## 2022-12-23 DIAGNOSIS — E782 Mixed hyperlipidemia: Secondary | ICD-10-CM

## 2022-12-23 DIAGNOSIS — L309 Dermatitis, unspecified: Secondary | ICD-10-CM | POA: Diagnosis not present

## 2022-12-23 DIAGNOSIS — I10 Essential (primary) hypertension: Secondary | ICD-10-CM

## 2022-12-23 LAB — CBC WITH DIFFERENTIAL/PLATELET
Basophils Absolute: 65 cells/uL (ref 0–200)
HCT: 43.1 % (ref 35.0–45.0)
MCH: 28 pg (ref 27.0–33.0)
Neutrophils Relative %: 68.9 %
Platelets: 321 10*3/uL (ref 140–400)
RBC: 5.08 10*6/uL (ref 3.80–5.10)
Total Lymphocyte: 19.1 %

## 2022-12-23 MED ORDER — MOMETASONE FUROATE 0.1 % EX CREA: TOPICAL_CREAM | Freq: Two times a day (BID) | CUTANEOUS | 1 refills | Status: AC

## 2022-12-23 NOTE — Progress Notes (Signed)
Subjective:    Patient ID: Paige Martinez, female    DOB: Jun 07, 1946, 77 y.o.   MRN: [id number]   Patient presents today with a rash.  Had this for months.  It begins as the coin-shaped lesion shown on her medial right thigh above.  It is a pink/erythematous plaque of coalescent red papules and scale without any sharp borders.  She has a lesion on the left right tricep.  It will then progressed to the plaque-like rash shown on the dorsum of her right hand.  She has been applying Neosporin and alcohol to this rash which is dried out the rash severely causing lichenification, desquamation, and weeping sores.  The rash on her hand and now looks like dyshidrotic eczema but I believe that this is partly self-inflicted due to the alcohol and the Neosporin.  Patient thought the rash was due to her metoprolol so she stopped this gradually.  She has a history of atrial fibrillation status post ablation Past Medical History:  Diagnosis Date   Atrial flutter (HCC)    Gout    Hypertension    Paroxysmal atrial fibrillation (HCC)    Venereal warts 08/29/2000   Vitamin D deficiency 04/18/2008   Past Surgical History:  Procedure Laterality Date   ATRIAL FIBRILLATION ABLATION N/A 02/11/2020   Procedure: ATRIAL FIBRILLATION ABLATION;  Surgeon: Hillis Range, MD;  [facility]: [facility];  Service: Cardiovascular;  Laterality: N/A;   CATARACT EXTRACTION W/PHACO Left 03/11/2019   Procedure: CATARACT EXTRACTION PHACO AND INTRAOCULAR LENS PLACEMENT LEFT EYE;  Surgeon: Fabio Pierce, MD;  Location: [facility];  Service: Ophthalmology;  Laterality: Left;  CDE: 11.60   COLONOSCOPY N/A 07/09/2019   Procedure: COLONOSCOPY;  Surgeon: Corbin Ade, MD;  Location: [facility];  Service: Endoscopy;  Laterality: N/A;  10:30AM   POLYPECTOMY  07/09/2019   Procedure: POLYPECTOMY;  Surgeon: Corbin Ade, MD;  Location: [facility];  Service: Endoscopy;;   TONSILLECTOMY     TUBAL LIGATION     Current Outpatient  Medications on File Prior to Visit  Medication Sig Dispense Refill   aspirin 325 MG tablet Take 325 mg by mouth daily.     Calcium Citrate-Vitamin D (CALCIUM + D PO) Take 1 tablet by mouth daily.      Cholecalciferol (VITAMIN D) 50 MCG (2000 UT) tablet Take 2,000 Units by mouth daily.     colchicine 0.6 MG tablet Take 2 pills immediately and 1 more in 1 hour as needed for gout attack. 30 tablet 0   ibuprofen (ADVIL) 200 MG tablet Take 200 mg by mouth daily as needed for headache or moderate pain.     metoprolol tartrate (LOPRESSOR) 25 MG tablet Take 1 tablet (25 mg total) by mouth 2 (two) times daily. 90 tablet 1   Multiple Vitamin (MULTIVITAMIN) tablet Take 1 tablet by mouth at bedtime.      Probiotic CAPS Take 2 capsules by mouth at bedtime.     No current facility-administered medications on file prior to visit.   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 --20 minutes   Social Determinants of Health   Financial Resource Strain: Low Risk  (09/07/2022)   Overall Financial Resource Strain (CARDIA)    Difficulty of Paying Living Expenses: Not hard at all  Food Insecurity: No Food Insecurity (09/07/2022)   Hunger Vital Sign    Worried About Running Out of Food in the Last Year: Never true    Ran Out of Food in the Last Year:  Never true  Transportation Needs: No Transportation Needs (09/07/2022)   PRAPARE - Administrator, Civil Service (Medical): No    Lack of Transportation (Non-Medical): No  Physical Activity: Insufficiently Active (09/07/2022)   Exercise Vital Sign    Days of Exercise per Week: 3 days    Minutes of Exercise per Session: 30 min  Stress: No Stress Concern Present (09/07/2022)   [employer] of Occupational Health - Occupational Stress Questionnaire    Feeling of Stress : Not at all  Social Connections: Socially Isolated (09/07/2022)   Social Connection and Isolation Panel [NHANES]    Frequency of Communication with Friends and Family: More than three times a week    Frequency of Social Gatherings with Friends and Family: More than three times a week    Attends Religious Services: Never    Database administrator or Organizations: No    Attends Banker Meetings: Never    Marital Status: Divorced  Catering manager Violence: Not At Risk (09/07/2022)   Humiliation, Afraid, Rape, and Kick questionnaire    Fear of Current or Ex-Partner: No    Emotionally Abused: No    Physically Abused: No    Sexually Abused: No      Review of Systems  Skin:  Positive for rash.  All other systems reviewed and are negative.      Objective:   Physical Exam Vitals reviewed.  Constitutional:      Appearance: Normal appearance. She is normal weight.  Cardiovascular:     Rate and Rhythm: Normal rate and regular rhythm.     Pulses:          Dorsalis pedis pulses are 2+ on the right side.       Posterior tibial pulses are 2+ on the right side.     Heart sounds: Normal heart sounds. No murmur heard. Pulmonary:     Effort: Pulmonary effort is normal. No respiratory distress.     Breath sounds: Normal breath sounds. No stridor. No wheezing or rhonchi.  Musculoskeletal:     Right lower leg: No edema.     Left lower leg: No edema.     Right foot: Normal range of motion. No  deformity or bunion.  Feet:     Right foot:     Skin integrity: Skin integrity normal.  Skin:    Findings: Rash present. Rash is crusting, macular, papular and scaling.  Neurological:     Mental Status: She is alert.           Assessment & Plan:  Primary hypertension - Plan: CBC with Differential/Platelet, Lipid panel, COMPLETE METABOLIC PANEL WITH  GFR  Atrial fibrillation, unspecified type (HCC)  Mixed hyperlipidemia - Plan: CBC with Differential/Platelet, Lipid panel, COMPLETE METABOLIC PANEL WITH GFR  Dermatitis Patient's blood pressure has been elevated.  I have asked her to resume the metoprolol.  I do not believe the metoprolol is causing the rash.  It does not appear to be a drug eruption.  I will check a CBC, CMP, and a lipid panel.  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  We does not premedicate for areas and then recheck in 10 to 14 days to see if the patient is doing better

## 2022-12-24 LAB — CBC WITH DIFFERENTIAL/PLATELET
Absolute Monocytes: 796 cells/uL (ref 200–950)
Basophils Relative: 0.6 %
Eosinophils Absolute: 447 cells/uL (ref 15–500)
Eosinophils Relative: 4.1 %
Hemoglobin: 14.2 g/dL (ref 11.7–15.5)
Lymphs Abs: 2082 cells/uL (ref 850–3900)
MCHC: 32.9 g/dL (ref 32.0–36.0)
MCV: 84.8 fL (ref 80.0–100.0)
MPV: 11.4 fL (ref 7.5–12.5)
Monocytes Relative: 7.3 %
Neutro Abs: 7510 cells/uL (ref 1500–7800)
RDW: 13 % (ref 11.0–15.0)
WBC: 10.9 10*3/uL — ABNORMAL HIGH (ref 3.8–10.8)

## 2022-12-24 LAB — LIPID PANEL
Cholesterol: 182 mg/dL (ref <200)
HDL: 62 mg/dL (ref >=50)
LDL Cholesterol (Calc): 96 mg/dL (calc)
Non-HDL Cholesterol (Calc): 120 mg/dL (calc) (ref <130)
Total CHOL/HDL Ratio: 2.9 (calc) (ref <5.0)
Triglycerides: 144 mg/dL (ref <150)

## 2022-12-24 LAB — COMPLETE METABOLIC PANEL WITH GFR
AG Ratio: 1.7 (calc) (ref 1.0–2.5)
ALT: 13 U/L (ref 6–29)
AST: 16 U/L (ref 10–35)
Albumin: 3.9 g/dL (ref 3.6–5.1)
Alkaline phosphatase (APISO): 134 U/L (ref 37–153)
BUN: 16 mg/dL (ref 7–25)
CO2: 28 mmol/L (ref 20–32)
Calcium: 9.7 mg/dL (ref 8.6–10.4)
Chloride: 105 mmol/L (ref 98–110)
Creat: 0.71 mg/dL (ref 0.60–1.00)
Globulin: 2.3 g/dL (calc) (ref 1.9–3.7)
Glucose, Bld: 85 mg/dL (ref 65–99)
Potassium: 4.7 mmol/L (ref 3.5–5.3)
Sodium: 142 mmol/L (ref 135–146)
Total Bilirubin: 0.3 mg/dL (ref 0.2–1.2)
Total Protein: 6.2 g/dL (ref 6.1–8.1)
eGFR: 88 mL/min/{1.73_m2} (ref >=60)

## 2023-01-06 ENCOUNTER — Encounter: Payer: Self-pay | Admitting: Family Medicine

## 2023-01-06 ENCOUNTER — Ambulatory Visit (INDEPENDENT_AMBULATORY_CARE_PROVIDER_SITE_OTHER): Payer: Medicare Other | Admitting: Family Medicine

## 2023-01-06 VITALS — BP 137/82 | HR 58 | Temp 97.8°F | Ht 70.0 in | Wt 195.0 lb

## 2023-01-06 DIAGNOSIS — I10 Essential (primary) hypertension: Secondary | ICD-10-CM | POA: Diagnosis not present

## 2023-01-06 DIAGNOSIS — L309 Dermatitis, unspecified: Secondary | ICD-10-CM | POA: Diagnosis not present

## 2023-01-06 NOTE — Progress Notes (Signed)
Subjective:    Patient ID: Paige Martinez, female    DOB: 30-Aug-1945, 77 y.o.   MRN: [id number]   Patient presents today with a rash.  Had this for months.  It begins as the coin-shaped lesion shown on her medial right thigh above.  It is a pink/erythematous plaque of coalescent red papules and scale without any sharp borders.  She has a lesion on the left right tricep.  It will then progressed to the plaque-like rash shown on the dorsum of her right hand.  She has been applying Neosporin and alcohol to this rash which is dried out the rash severely causing lichenification, desquamation, and weeping sores.  The rash on her hand and now looks like dyshidrotic eczema but I believe that this is partly self-inflicted due to the alcohol and the Neosporin.  Patient thought the rash was due to her metoprolol so she stopped this gradually.  She has a history of atrial fibrillation status post ablation  At that time, my plan was: Patient's blood pressure has been elevated.  I have asked her to resume the metoprolol.  I do not believe the metoprolol is causing the rash.  It does not appear to be a drug eruption.  I will check a CBC, CMP, and a lipid panel.  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  Apply elocon 1-2/day for areas and then recheck in 10 to 14 days to see if the patient is doing better  01/06/23  The rash on the patient's hand looks much better.  Please see the photograph above.  She denies any itching or burning.  Still has some slight hyperpigmentation due to inflammation.  She is back to taking her metoprolol.  Her blood pressure today is acceptable.  She states that her blood pressure at home is also well-controlled.  She denies seeing any systolic blood pressures greater than 140 or diastolic blood pressures greater than 90.  Her most recent lab work is listed below Office Visit on 12/23/2022  Component Date Value Ref Range Status   WBC 12/23/2022 10.9 (H)  3.8 - 10.8  Thousand/uL Final   RBC 12/23/2022 5.08  3.80 - 5.10 Million/uL Final   Hemoglobin 12/23/2022 14.2  11.7 - 15.5 g/dL Final   HCT 40/98/1191 43.1  35.0 - 45.0 % Final   MCV 12/23/2022 84.8  80.0 - 100.0 fL Final   MCH 12/23/2022 28.0  27.0 - 33.0 pg Final   MCHC 12/23/2022 32.9  32.0 - 36.0 g/dL Final   RDW 47/82/9562 13.0  11.0 - 15.0 % Final   Platelets 12/23/2022 321  140 - 400 Thousand/uL Final   MPV 12/23/2022 11.4  7.5 - 12.5 fL Final   Neutro Abs 12/23/2022 7,510  1,500 - 7,800 cells/uL Final   Lymphs Abs 12/23/2022 2,082  850 - 3,900 cells/uL Final   Absolute Monocytes 12/23/2022 796  200 - 950 cells/uL Final   Eosinophils Absolute 12/23/2022 447  15 - 500 cells/uL Final   Basophils Absolute 12/23/2022 65  0 - 200 cells/uL Final   Neutrophils Relative % 12/23/2022 68.9  % Final   Total Lymphocyte 12/23/2022 19.1  % Final   Monocytes Relative 12/23/2022 7.3  % Final   Eosinophils Relative 12/23/2022 4.1  % Final   Basophils Relative 12/23/2022 0.6  % Final   Cholesterol 12/23/2022 182  <200 mg/dL Final   HDL 13/03/6577 62  > OR = 50 mg/dL Final   Triglycerides 46/96/2952 144  <150  mg/dL Final   LDL Cholesterol (Calc) 12/23/2022 96  mg/dL (calc) Final   Comment: Reference range: <100 . Desirable range <100 mg/dL for primary prevention;   <70 mg/dL for patients with CHD or diabetic patients  with > or = 2 CHD risk factors. Marland Kitchen LDL-C is now calculated using the Martin-Hopkins  calculation, which is a validated novel method providing  better accuracy than the Friedewald equation in the  estimation of LDL-C.  Horald Pollen et al. Lenox Ahr. 1478;295(62): 2061-2068  (http://education.[facility].com/faq/FAQ164)    Total CHOL/HDL Ratio 12/23/2022 2.9  <1.3 (calc) Final   Non-HDL Cholesterol (Calc) 12/23/2022 120  <130 mg/dL (calc) Final   Comment: For patients with diabetes plus 1 major ASCVD risk  factor, treating to a non-HDL-C goal of <100 mg/dL  (LDL-C of <08 mg/dL) is  considered a therapeutic  option.    Glucose, Bld 12/23/2022 85  65 - 99 mg/dL Final   Comment: .            Fasting reference interval .    BUN 12/23/2022 16  7 - 25 mg/dL Final   Creat 65/78/4696 0.71  0.60 - 1.00 mg/dL Final   eGFR 29/52/8413 88  > OR = 60 mL/min/1.20m2 Final   BUN/Creatinine Ratio 12/23/2022 SEE NOTE:  6 - 22 (calc) Final   Comment:    Not Reported: BUN and Creatinine are within    reference range. .    Sodium 12/23/2022 142  135 - 146 mmol/L Final   Potassium 12/23/2022 4.7  3.5 - 5.3 mmol/L Final   Chloride 12/23/2022 105  98 - 110 mmol/L Final   CO2 12/23/2022 28  20 - 32 mmol/L Final   Calcium 12/23/2022 9.7  8.6 - 10.4 mg/dL Final   Total Protein 24/40/1027 6.2  6.1 - 8.1 g/dL Final   Albumin 25/36/6440 3.9  3.6 - 5.1 g/dL Final   Globulin 34/74/2595 2.3  1.9 - 3.7 g/dL (calc) Final   AG Ratio 12/23/2022 1.7  1.0 - 2.5 (calc) Final   Total Bilirubin 12/23/2022 0.3  0.2 - 1.2 mg/dL Final   Alkaline phosphatase (APISO) 12/23/2022 134  37 - 153 U/L Final   AST 12/23/2022 16  10 - 35 U/L Final   ALT 12/23/2022 13  6 - 29 U/L Final    Past Medical History:  Diagnosis Date   Atrial flutter (HCC)    Gout    Hypertension    Paroxysmal atrial fibrillation (HCC)    Venereal warts 08/29/2000   Vitamin D deficiency 04/18/2008   Past Surgical History:  Procedure Laterality Date   ATRIAL FIBRILLATION ABLATION N/A 02/11/2020   Procedure: ATRIAL FIBRILLATION ABLATION;  Surgeon: Hillis Range, MD;  Location: [facility];  Service: Cardiovascular;  Laterality: N/A;   CATARACT EXTRACTION W/PHACO Left 03/11/2019   Procedure: CATARACT EXTRACTION PHACO AND INTRAOCULAR LENS PLACEMENT LEFT EYE;  Surgeon: Fabio Pierce, MD;  Location: [facility];  Service: Ophthalmology;  Laterality: Left;  CDE: 11.60   COLONOSCOPY N/A 07/09/2019   Procedure: COLONOSCOPY;  Surgeon: Corbin Ade, MD;  [facility];  Service: Endoscopy;  Laterality: N/A;  10:30AM    POLYPECTOMY  07/09/2019   Procedure: POLYPECTOMY;  Surgeon: Corbin Ade, MD;  [facility]: [facility];  Service: Endoscopy;;   TONSILLECTOMY     TUBAL LIGATION     Current Outpatient Medications on File Prior to Visit  Medication Sig Dispense Refill   aspirin 325 MG tablet Take 325 mg by mouth  daily.     Calcium Citrate-Vitamin D (CALCIUM + D PO) Take 1 tablet by mouth daily.      Cholecalciferol (VITAMIN D) 50 MCG (2000 UT) tablet Take 2,000 Units by mouth daily.     colchicine 0.6 MG tablet Take 2 pills immediately and 1 more in 1 hour as needed for gout attack. 30 tablet 0   ibuprofen (ADVIL) 200 MG tablet Take 200 mg by mouth daily as needed for headache or moderate pain.     metoprolol tartrate (LOPRESSOR) 25 MG tablet Take 1 tablet (25 mg total) by mouth 2 (two) times daily. 90 tablet 1   mometasone (ELOCON) 0.1 % cream Apply topically in the morning and at bedtime. 45 g 1   Multiple Vitamin (MULTIVITAMIN) tablet Take 1 tablet by mouth at bedtime.      Probiotic CAPS Take 2 capsules by mouth at bedtime.     No current facility-administered medications on file prior to visit.   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 --20 minutes   Social Determinants of Health   Financial Resource Strain: Low Risk  (09/07/2022)    Overall Financial Resource Strain (CARDIA)    Difficulty of Paying Living Expenses: Not hard at all  Food Insecurity: No Food Insecurity (09/07/2022)   Hunger Vital Sign    Worried About Running Out of Food in the Last Year: Never true    Ran Out of Food in the Last Year: Never true  Transportation Needs: No Transportation Needs (09/07/2022)   PRAPARE - Administrator, Civil Service (Medical): No    Lack of Transportation (Non-Medical): No  Physical Activity: Insufficiently Active (09/07/2022)   Exercise Vital Sign    Days of Exercise per Week: 3 days    Minutes of Exercise per Session: 30 min  Stress: No Stress Concern Present (09/07/2022)   [employer] of Occupational Health - Occupational Stress Questionnaire    Feeling of Stress : Not at all  Social Connections: Socially Isolated (09/07/2022)   Social Connection and Isolation Panel [NHANES]    Frequency of Communication with Friends and Family: More than three times a week    Frequency of Social Gatherings with Friends and Family: More than three times a week    Attends Religious Services: Never    Database administrator or Organizations: No    Attends Banker Meetings: Never    Marital Status: Divorced  Catering manager Violence: Not At Risk (09/07/2022)   Humiliation, Afraid, Rape, and Kick questionnaire    Fear of Current or Ex-Partner: No    Emotionally Abused: No    Physically Abused: No    Sexually Abused:  No      Review of Systems  Skin:  Positive for rash.  All other systems reviewed and are negative.      Objective:   Physical Exam Vitals reviewed.  Constitutional:      Appearance: Normal appearance. She is normal weight.  Cardiovascular:     Rate and Rhythm: Normal rate and regular rhythm.     Pulses:          Dorsalis pedis pulses are 2+ on the right side.       Posterior tibial pulses are 2+ on the right side.     Heart sounds: Normal heart sounds. No murmur  heard. Pulmonary:     Effort: Pulmonary effort is normal. No respiratory distress.     Breath sounds: Normal breath sounds. No stridor. No wheezing or rhonchi.  Musculoskeletal:     Right lower leg: No edema.     Left lower leg: No edema.     Right foot: Normal range of motion. No deformity or bunion.  Feet:     Right foot:     Skin integrity: Skin integrity normal.  Neurological:     Mental Status: She is alert.           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  I recommended that she use moisturizers to help control the rash in the future and then use the steroid sparing outbreaks.  Blood pressure is now well-controlled.  I reviewed her lab work with her which was excellent.  No changes at the present time

## 2023-04-10 ENCOUNTER — Other Ambulatory Visit: Payer: Self-pay | Admitting: Family Medicine

## 2023-04-10 DIAGNOSIS — I10 Essential (primary) hypertension: Secondary | ICD-10-CM

## 2023-04-12 NOTE — Telephone Encounter (Signed)
Requested Prescriptions  Pending Prescriptions Disp Refills   metoprolol tartrate (LOPRESSOR) 25 MG tablet [Pharmacy Med Name: Metoprolol Tartrate 25 MG Oral Tablet] 180 tablet 0    Sig: Take 1 tablet by mouth twice daily     Cardiovascular:  Beta Blockers Failed - 04/10/2023  4:54 PM      Failed - Valid encounter within last 6 months    Recent Outpatient Visits           1 year ago Podagra   [facility] Pickard, Priscille Heidelberg, MD   1 year ago Primary hypertension   [facility] Tanya Nones, Priscille Heidelberg, MD   2 years ago Encounter for screening mammogram for malignant neoplasm of breast   [facility] Donita Brooks, MD   4 years ago Irregular heart beat   [facility] Tanya Nones, Priscille Heidelberg, MD   4 years ago Mixed hyperlipidemia   [facility] Danelle Berry, PA-C              Passed - Last BP in normal range    BP Readings from Last 1 Encounters:  01/06/23 137/82         Passed - Last Heart Rate in normal range    Pulse Readings from Last 1 Encounters:  01/06/23 (!) 58

## 2023-06-08 DIAGNOSIS — Z23 Encounter for immunization: Secondary | ICD-10-CM | POA: Diagnosis not present

## 2023-08-30 HISTORY — PX: CATARACT EXTRACTION: SUR2

## 2023-09-08 ENCOUNTER — Ambulatory Visit: Payer: Medicare Other | Admitting: Family Medicine

## 2023-09-14 ENCOUNTER — Ambulatory Visit (INDEPENDENT_AMBULATORY_CARE_PROVIDER_SITE_OTHER): Payer: Medicare Other | Admitting: Family Medicine

## 2023-09-14 ENCOUNTER — Encounter: Payer: Self-pay | Admitting: Family Medicine

## 2023-09-14 VITALS — BP 132/76 | HR 68 | Temp 97.8°F | Ht 70.0 in | Wt 200.0 lb

## 2023-09-14 DIAGNOSIS — Z0001 Encounter for general adult medical examination with abnormal findings: Secondary | ICD-10-CM | POA: Diagnosis not present

## 2023-09-14 DIAGNOSIS — Z1231 Encounter for screening mammogram for malignant neoplasm of breast: Secondary | ICD-10-CM | POA: Diagnosis not present

## 2023-09-14 DIAGNOSIS — Z0000 Encounter for general adult medical examination without abnormal findings: Secondary | ICD-10-CM

## 2023-09-14 DIAGNOSIS — Z23 Encounter for immunization: Secondary | ICD-10-CM

## 2023-09-14 DIAGNOSIS — I4891 Unspecified atrial fibrillation: Secondary | ICD-10-CM

## 2023-09-14 DIAGNOSIS — E782 Mixed hyperlipidemia: Secondary | ICD-10-CM

## 2023-09-14 DIAGNOSIS — I10 Essential (primary) hypertension: Secondary | ICD-10-CM

## 2023-09-14 DIAGNOSIS — M8580 Other specified disorders of bone density and structure, unspecified site: Secondary | ICD-10-CM

## 2023-09-14 DIAGNOSIS — Z780 Asymptomatic menopausal state: Secondary | ICD-10-CM | POA: Diagnosis not present

## 2023-09-14 MED ORDER — METOPROLOL TARTRATE 25 MG PO TABS: 25.0000 mg | ORAL_TABLET | Freq: Two times a day (BID) | ORAL | 3 refills | Status: DC

## 2023-09-14 NOTE — Progress Notes (Signed)
Subjective:   Paige Martinez is a 78 y.o. female who presents for Medicare Annual (Subsequent) preventive examination.  Visit Complete: In person  Patient Medicare AWV questionnaire was completed by the patient on 09/14/2023; I have confirmed that all information answered by patient is correct and no changes since this date.  Cardiac Risk Factors include: advanced age (>77men, >61 women)     Objective:    Today's Vitals   09/14/23 1409  BP: 132/76  Pulse: 68  Temp: 97.8 F (36.6 C)  SpO2: 98%  Weight: 200 lb (90.7 kg)  Height: 5\' 10"  (1.778 m)   Body mass index is 28.7 kg/m.     09/14/2023    2:20 PM 09/07/2022    3:38 PM 08/19/2021   10:26 AM 07/10/2020    9:18 AM 02/11/2020    6:11 AM 07/09/2019    9:39 AM 03/25/2019    9:17 AM  Advanced Directives  Does Patient Have a Medical Advance Directive? No Yes No No No No No  Type of Furniture conservator/restorer;Living will       Copy of Healthcare Power of Attorney in Chart?  No - copy requested       Would patient like information on creating a medical advance directive? No - Patient declined    No - Patient declined No - Patient declined No - Patient declined    Current Medications (verified) Outpatient Encounter Medications as of 09/14/2023  Medication Sig   Calcium Citrate-Vitamin D (CALCIUM + D PO) Take 1 tablet by mouth daily.    Cholecalciferol (VITAMIN D) 50 MCG (2000 UT) tablet Take 2,000 Units by mouth daily.   ibuprofen (ADVIL) 200 MG tablet Take 200 mg by mouth daily as needed for headache or moderate pain.   mometasone (ELOCON) 0.1 % cream Apply topically in the morning and at bedtime.   Multiple Vitamin (MULTIVITAMIN) tablet Take 1 tablet by mouth at bedtime.    Probiotic CAPS Take 2 capsules by mouth at bedtime.   [DISCONTINUED] metoprolol tartrate (LOPRESSOR) 25 MG tablet Take 1 tablet by mouth twice daily   aspirin 325 MG tablet Take 325 mg by mouth daily. (Patient not taking:  Reported on 09/14/2023)   colchicine 0.6 MG tablet Take 2 pills immediately and 1 more in 1 hour as needed for gout attack. (Patient not taking: Reported on 09/14/2023)   metoprolol tartrate (LOPRESSOR) 25 MG tablet Take 1 tablet (25 mg total) by mouth 2 (two) times daily.   No facility-administered encounter medications on file as of 09/14/2023.    Allergies (verified) Patient has no known allergies.   History: Past Medical History:  Diagnosis Date   Atrial flutter (HCC)    Gout    Hypertension    Paroxysmal atrial fibrillation (HCC)    Venereal warts 08/29/2000   Vitamin D deficiency 04/18/2008   Past Surgical History:  Procedure Laterality Date   ATRIAL FIBRILLATION ABLATION N/A 02/11/2020   Procedure: ATRIAL FIBRILLATION ABLATION;  Surgeon: Hillis Range, MD;  Location: [facility];  Service: Cardiovascular;  Laterality: N/A;   CATARACT EXTRACTION W/PHACO Left 03/11/2019   Procedure: CATARACT EXTRACTION PHACO AND INTRAOCULAR LENS PLACEMENT LEFT EYE;  Surgeon: Fabio Pierce, MD;  Location: [facility];  Service: Ophthalmology;  Laterality: Left;  CDE: 11.60   COLONOSCOPY N/A 07/09/2019   Procedure: COLONOSCOPY;  Surgeon: Corbin Ade, MD;  Location: [facility];  Service: Endoscopy;  Laterality: N/A;  10:30AM   POLYPECTOMY  07/09/2019   Procedure: POLYPECTOMY;  Surgeon: Corbin Ade, MD;  Location: [facility];  Service: Endoscopy;;   TONSILLECTOMY     TUBAL LIGATION     Family History  Problem Relation Age of Onset   Alcohol abuse Father    Cancer Father 45       Pancreatic Cancer   Hypertension Sister    Hypertension Brother    Colon cancer Paternal Grandmother        greater than 69 years old   Social History   Socioeconomic History   Marital status: Divorced    Spouse name: Not on file   Number of children: Not on file   Years of education: Not on file   Highest education level: Not on file  Occupational History   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 --20 minutes   Social Drivers of Corporate investment banker Strain: Low Risk  (09/14/2023)   Overall Financial Resource Strain (CARDIA)    Difficulty of Paying Living Expenses: Not hard at all  Food Insecurity: Patient Declined (09/14/2023)   Hunger Vital Sign    Worried About Running Out of Food in the Last Year: Patient declined    Ran Out of Food in the Last Year: Patient declined  Transportation Needs: No Transportation Needs (09/14/2023)   PRAPARE - Administrator, Civil Service (Medical): No    Lack of Transportation (Non-Medical): No  Physical Activity: Insufficiently Active (09/14/2023)   Exercise Vital Sign    Days of Exercise per Week: 3 days    Minutes of Exercise per Session: 10 min  Stress: No Stress Concern Present (09/14/2023)   [employer] of Occupational Health - Occupational Stress Questionnaire    Feeling of Stress : Not at all  Social Connections: Moderately Isolated (09/14/2023)   Social Connection and Isolation Panel [NHANES]    Frequency of Communication with Friends and Family: More than three times a week    Frequency of Social Gatherings with Friends and Family: More than three times a week    Attends Religious Services: Never    Database administrator or Organizations: No    Attends Engineer, structural: 1 to 4 times per year    Marital Status: Divorced    Tobacco Counseling Counseling given: Not Answered   Clinical Intake:  Pre-visit preparation completed: Yes  Pain : No/denies pain     BMI -  recorded: 28.7 Nutritional Status: BMI 25 -29 Overweight Nutritional Risks: None Diabetes: No  How often do you need to have someone help you when you read instructions, pamphlets, or other written materials from your doctor or pharmacy?: 1 - Never  Interpreter Needed?: No  Information entered by :: mj Serafina Topham, lpn   Activities of Daily Living    09/14/2023    2:21 PM  In your present state of health, do you have any difficulty performing the following activities:  Hearing? 0  Vision? 0  Difficulty concentrating or making decisions? 0  Walking or climbing stairs? 0  Dressing or bathing? 0  Doing errands, shopping? 0  Preparing Food and eating ? N  Using the Toilet? N  In the past six months, have you accidently leaked urine? N  Do you have problems with loss of bowel control? N  Managing your Medications? N  Managing your Finances? N  Housekeeping or managing your Housekeeping? N    Patient Care Team: Donita Brooks, MD as PCP - General (Family Medicine) Jena Gauss Gerrit Friends, MD as Consulting Physician (Gastroenterology)  Indicate any recent Medical Services you may have received from other than [facility] providers in the past year (date may be approximate).     Assessment:   This is a routine wellness examination for [place].  Hearing/Vision screen Hearing Screening - Comments:: No issues.   Vision Screening - Comments:: Wears readers.    Goals Addressed             This Visit's Progress    Exercise 3x per week (30 min per time)       Continue to try to exercise 3 x per week.        Depression Screen    09/14/2023    2:16 PM 12/23/2022   11:25 AM 09/07/2022    3:33 PM 08/19/2021   10:26 AM 07/10/2020    9:18 AM 07/12/2018    8:41 AM 06/21/2017    8:18 AM  PHQ 2/9 Scores  PHQ - 2 Score 0 0 0 0 0 2 2  PHQ- 9 Score      6 7    Fall Risk    09/14/2023    2:21 PM 12/23/2022   11:25 AM 09/07/2022    3:38 PM 08/19/2021   10:26 AM 07/10/2020    9:18 AM   Fall Risk   Falls in the past year? 0 0 0 0 0  Number falls in past yr: 0 0 0 0 0  Injury with Fall? 0 0 0 0 0  Risk for fall due to : No Fall Risks No Fall Risks No Fall Risks    Follow up Falls prevention discussed Falls prevention discussed Falls prevention discussed      MEDICARE RISK AT HOME:    TIMED UP AND GO:  Was the test performed?  Yes  Length of time to ambulate 10 feet: 6 sec Gait steady and fast without use of assistive device    Cognitive Function:        09/14/2023    2:21 PM 09/07/2022    3:41 PM 07/10/2020    9:19 AM  6CIT Screen  What Year? 0 points 0 points 0 points  What month? 0 points 0 points 0 points  What time? 0 points 0 points 0 points  Count back from 20 0 points 0 points 0 points  Months in reverse 0 points 0 points 0 points  Repeat phrase 2 points 0 points 0 points  Total Score 2 points 0 points 0 points    Immunizations Immunization History  Administered Date(s) Administered   Influenza Whole 05/29/2013   Influenza, High Dose Seasonal PF 06/22/2018, 05/31/2019   Influenza,inj,Quad PF,6+ Mos 06/05/2014, 06/11/2015, 06/16/2016, 06/21/2017   Influenza-Unspecified 06/19/2020, 06/15/2021, 05/30/2022, 06/08/2023   Moderna Sars-Covid-2 Vaccination 10/10/2019, 11/08/2019, 06/24/2020   Pneumococcal Conjugate-13 06/05/2014   Pneumococcal Polysaccharide-23 04/17/2009, 08/19/2021   RSV,unspecified 06/21/2022   Rsv, Bivalent, Protein Subunit Rsvpref,pf Verdis Frederickson) 06/09/2022   Tdap 06/27/2013   Zoster,  Live 06/10/2011    TDAP status: Due, Education has been provided regarding the importance of this vaccine. Advised may receive this vaccine at local pharmacy or Health Dept. Aware to provide a copy of the vaccination record if obtained from local pharmacy or Health Dept. Verbalized acceptance and understanding.  Flu Vaccine status: Up to date  Pneumococcal vaccine status: Up to date  Covid-19 vaccine status: Completed vaccines  Qualifies for  Shingles Vaccine? Yes   Zostavax completed Yes   Shingrix Completed?: No.    Education has been provided regarding the importance of this vaccine. Patient has been advised to call insurance company to determine out of pocket expense if they have not yet received this vaccine. Advised may also receive vaccine at local pharmacy or Health Dept. Verbalized acceptance and understanding.  Screening Tests Health Maintenance  Topic Date Due   Zoster Vaccines- Shingrix (1 of 2) 12/13/2023 (Originally 12/04/1995)   COVID-19 Vaccine (4 - 2024-25 season) 01/05/2024 (Originally 04/30/2023)   DTaP/Tdap/Td (2 - Td or Tdap) 09/13/2024 (Originally 06/28/2023)   Medicare Annual Wellness (AWV)  09/13/2024   Pneumonia Vaccine 61+ Years old  Completed   INFLUENZA VACCINE  Completed   DEXA SCAN  Completed   Hepatitis C Screening  Completed   HPV VACCINES  Aged Out   Colonoscopy  Discontinued    Health Maintenance  There are no preventive care reminders to display for this patient.   Colorectal cancer screening: No longer required.   Mammogram status: No longer required due to age.  Bone Density status: Completed 09/09/2020. Results reflect: Bone density results: NORMAL. Repeat every 0 years.  Lung Cancer Screening: (Low Dose CT Chest recommended if Age 67-80 years, 20 pack-year currently smoking OR have quit w/in 15years.) does not qualify.   Lung Cancer Screening Referral: n/a  Additional Screening:  Hepatitis C Screening: does qualify; Completed 1024/2018  Vision Screening: Recommended annual ophthalmology exams for early detection of glaucoma and other disorders of the eye. Is the patient up to date with their annual eye exam?   N/A Who is the provider or what is the name of the office in which the patient attends annual eye exams? N/A If pt is not established with a provider, would they like to be referred to a provider to establish care? No .   Dental Screening: Recommended annual dental exams  for proper oral hygiene  Diabetic Foot Exam: N/A   Community Resource Referral / Chronic Care Management: CRR required this visit?  No   CCM required this visit?  No     Plan:    Encounter for Medicare annual wellness exam  Atrial fibrillation, unspecified type (HCC)  Mixed hyperlipidemia  Primary hypertension - Plan: CBC with Differential/Platelet, Lipid panel, COMPLETE METABOLIC PANEL WITH GFR, metoprolol tartrate (LOPRESSOR) 25 MG tablet  Osteopenia, unspecified location - Plan: DG Bone Density  Encounter for screening mammogram for malignant neoplasm of breast - Plan: MM Digital Screening, CANCELED: MM Digital Screening  Postmenopausal estrogen deficiency - Plan: DG Bone Density  Need for vaccination - Plan: Pneumococcal Conjugate PCV21(Capvaxive)   I have personally reviewed and noted the following in the patient's chart:   Medical and social history Use of alcohol, tobacco or illicit drugs  Current medications and supplements including opioid prescriptions. Patient is not currently taking opioid prescriptions. Functional ability and status Nutritional status Physical activity Advanced directives List of other physicians Hospitalizations, surgeries, and ER visits in previous 12 months Vitals Screenings to include cognitive, depression, and falls  Referrals and appointments  In addition, I have reviewed and discussed with patient certain preventive protocols, quality metrics, and best practice recommendations. A written personalized care plan for preventive services as well as general preventive health recommendations were provided to patient.     Darral Dash, LPN   [id number]/[id number]   After Visit Summary: (In Person-Printed) AVS printed and given to the patient  Nurse Notes: Discussed Shingles vaccine and pt declined.  I have collaborated with the care management provider regarding care management and care coordination activities outlined in this encounter and  have reviewed this encounter including documentation in the note and care plan. I am certifying that I agree with the content of this note and encounter as supervising physician. I reviewed the patient's immunizations.  She is due for Shingrix which she declines.  She is also due for Capvaxive which she received today.  Patient is due for mammogram as well as a bone density.  I scheduled the patient for mammogram and also put in a referral for a bone density.  Her last colonoscopy was in 2020 and her gastroenterologist recommended no more future colonoscopies.  Given her age she does not require Pap smear.  Her blood pressure today is excellent.  I did ask the patient to get fasting lab work including a CBC a CMP and a fasting lipid panel.  Ideally I like to see her LDL cholesterol less than 629.  Encouraged the patient to take 1200 mg a day of calcium and 1000 units a day of vitamin D.

## 2023-09-15 LAB — COMPLETE METABOLIC PANEL WITH GFR
AG Ratio: 1.6 (calc) (ref 1.0–2.5)
ALT: 12 U/L (ref 6–29)
AST: 16 U/L (ref 10–35)
Albumin: 4.1 g/dL (ref 3.6–5.1)
Alkaline phosphatase (APISO): 116 U/L (ref 37–153)
BUN: 19 mg/dL (ref 7–25)
CO2: 26 mmol/L (ref 20–32)
Calcium: 10 mg/dL (ref 8.6–10.4)
Chloride: 104 mmol/L (ref 98–110)
Creat: 0.78 mg/dL (ref 0.60–1.00)
Globulin: 2.5 g/dL (ref 1.9–3.7)
Glucose, Bld: 83 mg/dL (ref 65–99)
Potassium: 5.1 mmol/L (ref 3.5–5.3)
Sodium: 140 mmol/L (ref 135–146)
Total Bilirubin: 0.4 mg/dL (ref 0.2–1.2)
Total Protein: 6.6 g/dL (ref 6.1–8.1)
eGFR: 78 mL/min/{1.73_m2} (ref >=60)

## 2023-09-15 LAB — CBC WITH DIFFERENTIAL/PLATELET
Absolute Lymphocytes: 2194 {cells}/uL (ref 850–3900)
Absolute Monocytes: 718 {cells}/uL (ref 200–950)
Basophils Absolute: 73 {cells}/uL (ref 0–200)
Basophils Relative: 0.7 %
Eosinophils Absolute: 354 {cells}/uL (ref 15–500)
Eosinophils Relative: 3.4 %
HCT: 42.8 % (ref 35.0–45.0)
Hemoglobin: 14.1 g/dL (ref 11.7–15.5)
MCH: 28.7 pg (ref 27.0–33.0)
MCHC: 32.9 g/dL (ref 32.0–36.0)
MCV: 87.2 fL (ref 80.0–100.0)
MPV: 11.9 fL (ref 7.5–12.5)
Monocytes Relative: 6.9 %
Neutro Abs: 7062 {cells}/uL (ref 1500–7800)
Neutrophils Relative %: 67.9 %
Platelets: 323 10*3/uL (ref 140–400)
RBC: 4.91 10*6/uL (ref 3.80–5.10)
RDW: 12.3 % (ref 11.0–15.0)
Total Lymphocyte: 21.1 %
WBC: 10.4 10*3/uL (ref 3.8–10.8)

## 2023-09-15 LAB — LIPID PANEL
Cholesterol: 182 mg/dL (ref <200)
HDL: 58 mg/dL (ref >=50)
LDL Cholesterol (Calc): 102 mg/dL — ABNORMAL HIGH
Non-HDL Cholesterol (Calc): 124 mg/dL (ref <130)
Total CHOL/HDL Ratio: 3.1 (calc) (ref <5.0)
Triglycerides: 122 mg/dL (ref <150)

## 2023-09-22 DIAGNOSIS — H43813 Vitreous degeneration, bilateral: Secondary | ICD-10-CM | POA: Diagnosis not present

## 2023-09-22 DIAGNOSIS — H2521 Age-related cataract, morgagnian type, right eye: Secondary | ICD-10-CM | POA: Diagnosis not present

## 2023-09-22 DIAGNOSIS — H524 Presbyopia: Secondary | ICD-10-CM | POA: Diagnosis not present

## 2023-09-22 DIAGNOSIS — H26492 Other secondary cataract, left eye: Secondary | ICD-10-CM | POA: Diagnosis not present

## 2023-09-22 DIAGNOSIS — H52202 Unspecified astigmatism, left eye: Secondary | ICD-10-CM | POA: Diagnosis not present

## 2023-10-02 DIAGNOSIS — H25811 Combined forms of age-related cataract, right eye: Secondary | ICD-10-CM | POA: Diagnosis not present

## 2023-10-02 DIAGNOSIS — H2521 Age-related cataract, morgagnian type, right eye: Secondary | ICD-10-CM | POA: Diagnosis not present

## 2023-10-02 DIAGNOSIS — H268 Other specified cataract: Secondary | ICD-10-CM | POA: Diagnosis not present

## 2023-10-26 ENCOUNTER — Telehealth: Payer: Self-pay

## 2023-10-26 NOTE — Telephone Encounter (Unsigned)
 Copied from CRM [id number]. Topic: Referral - Status >> Oct 26, 2023  8:19 AM Geroge Baseman wrote: Reason for CRM: Jacobo Forest::: Patient called to [facility] to schedule upcoming imaging but they do not have her orders. She said she was able to schedule with them but they do need her orders sent over before her appointment, which is in March. Confirmed with CAL that everything was there with her orders, just seems to be a missing link somewhere. Advised patient they can reach out to the referral coordinator Bonita Quin) per CAL, and provided the phone number to do so.

## 2023-11-07 DIAGNOSIS — M8588 Other specified disorders of bone density and structure, other site: Secondary | ICD-10-CM | POA: Diagnosis not present

## 2023-11-07 DIAGNOSIS — Z1231 Encounter for screening mammogram for malignant neoplasm of breast: Secondary | ICD-10-CM | POA: Diagnosis not present

## 2023-11-07 DIAGNOSIS — Z8262 Family history of osteoporosis: Secondary | ICD-10-CM | POA: Diagnosis not present

## 2023-11-07 LAB — HM DEXA SCAN

## 2023-11-07 LAB — HM MAMMOGRAPHY

## 2023-11-09 ENCOUNTER — Encounter: Payer: Self-pay | Admitting: Family Medicine

## 2024-06-18 DIAGNOSIS — Z23 Encounter for immunization: Secondary | ICD-10-CM | POA: Diagnosis not present

## 2024-09-18 NOTE — Patient Instructions (Incomplete)
 Paige Martinez,  Thank you for taking the time for your Medicare Wellness Visit. I appreciate your continued commitment to your health goals. Please review the care plan we discussed, and feel free to reach out if I can assist you further.  Please note that Annual Wellness Visits do not include a physical exam. Some assessments may be limited, especially if the visit was conducted virtually. If needed, we may recommend an in-person follow-up with your provider.  Ongoing Care Seeing your primary care provider every 3 to 6 months helps us  monitor your health and provide consistent, personalized care.   Referrals If a referral was made during today's visit and you haven't received any updates within two weeks, please contact the referred provider directly to check on the status.  Recommended Screenings:  Health Maintenance  Topic Date Due   Zoster (Shingles) Vaccine (1 of 2) 12/04/1995   DTaP/Tdap/Td vaccine (2 - Td or Tdap) 06/28/2023   COVID-19 Vaccine (4 - 2025-26 season) 04/29/2024   Medicare Annual Wellness Visit  09/19/2025   Pneumococcal Vaccine for age over 38  Completed   Flu Shot  Completed   Osteoporosis screening with Bone Density Scan  Completed   Hepatitis C Screening  Completed   Meningitis B Vaccine  Aged Out   Breast Cancer Screening  Discontinued   Colon Cancer Screening  Discontinued       09/19/2024    1:54 PM  Advanced Directives  Does Patient Have a Medical Advance Directive? No  Would patient like information on creating a medical advance directive? Yes (MAU/Ambulatory/Procedural Areas - Information given)   Information on Advanced Care Planning can be found at [place]  Secretary of [facility] Advance Health Care Directives Advance Health Care Directives ([email or web address]   Vision: Annual vision screenings are recommended for early detection of glaucoma, cataracts, and diabetic retinopathy. These exams can also reveal signs of chronic conditions such as diabetes and  high blood pressure.  Dental: Annual dental screenings help detect early signs of oral cancer, gum disease, and other conditions linked to overall health, including heart disease and diabetes.  Please see the attached documents for additional preventive care recommendations.

## 2024-09-19 ENCOUNTER — Ambulatory Visit

## 2024-09-19 VITALS — BP 130/76 | Ht 70.0 in | Wt 205.0 lb

## 2024-09-19 DIAGNOSIS — I10 Essential (primary) hypertension: Secondary | ICD-10-CM

## 2024-09-19 DIAGNOSIS — Z0001 Encounter for general adult medical examination with abnormal findings: Secondary | ICD-10-CM

## 2024-09-19 DIAGNOSIS — Z0000 Encounter for general adult medical examination without abnormal findings: Secondary | ICD-10-CM

## 2024-09-19 MED ORDER — METOPROLOL TARTRATE 25 MG PO TABS: 25.0000 mg | ORAL_TABLET | Freq: Two times a day (BID) | ORAL | 3 refills | Status: AC

## 2024-09-19 NOTE — Progress Notes (Signed)
 "  Chief Complaint  Patient presents with   Medicare Wellness     Subjective:   Paige Martinez is a 79 y.o. female who presents for a Medicare Annual Wellness Visit.  Visit info / Clinical Intake: Medicare Wellness Visit Type:: Subsequent Annual Wellness Visit Persons participating in visit and providing information:: patient Medicare Wellness Visit Mode:: In-person (required for WTM) Interpreter Needed?: No Pre-visit prep was completed: yes AWV questionnaire completed by patient prior to visit?: no Living arrangements:: (!) lives alone Patient's Overall Health Status Rating: good Typical amount of pain: some Does pain affect daily life?: no Are you currently prescribed opioids?: no  Dietary Habits and Nutritional Risks How many meals a day?: 3 Eats fruit and vegetables daily?: yes Most meals are obtained by: preparing own meals In the last 2 weeks, have you had any of the following?: none Diabetic:: no  Functional Status Activities of Daily Living (to include ambulation/medication): Independent Ambulation: Independent Medication Administration: Independent Home Management (perform basic housework or laundry): Independent Manage your own finances?: yes Primary transportation is: driving Concerns about vision?: no *vision screening is required for WTM* Concerns about hearing?: no  Fall Screening Falls in the past year?: 1 Number of falls in past year: 1 Was there an injury with Fall?: 0 Fall Risk Category Calculator: 2 Patient Fall Risk Level: Moderate Fall Risk  Fall Risk Patient at Risk for Falls Due to: History of fall(s) Fall risk Follow up: Falls evaluation completed; Education provided; Falls prevention discussed  Home and Transportation Safety: All rugs have non-skid backing?: yes All stairs or steps have railings?: yes Grab bars in the bathtub or shower?: yes Have non-skid surface in bathtub or shower?: yes Good home lighting?: yes Regular seat belt  use?: yes Hospital stays in the last year:: no  Cognitive Assessment Difficulty concentrating, remembering, or making decisions? : no Will 6CIT or Mini Cog be Completed: yes What year is it?: 0 points What month is it?: 0 points Give patient an address phrase to remember (5 components): [address] [place] About what time is it?: 0 points Count backwards from 20 to 1: 0 points Say the months of the year in reverse: 0 points Repeat the address phrase from earlier: 0 points 6 CIT Score: 0 points  Advance Directives (For Healthcare) Does Patient Have a Medical Advance Directive?: No Would patient like information on creating a medical advance directive?: Yes (MAU/Ambulatory/Procedural Areas - Information given)  Reviewed/Updated  Reviewed/Updated: Reviewed All (Medical, Surgical, Family, Medications, Allergies, Care Teams, Patient Goals)    Allergies (verified) Patient has no known allergies.   Current Medications (verified) Outpatient Encounter Medications as of 09/19/2024  Medication Sig   Calcium Citrate-Vitamin D  (CALCIUM + D PO) Take 1 tablet by mouth daily.    Cholecalciferol (VITAMIN D ) 50 MCG (2000 UT) tablet Take 2,000 Units by mouth daily.   ibuprofen (ADVIL) 200 MG tablet Take 200 mg by mouth daily as needed for headache or moderate pain.   metoprolol  tartrate (LOPRESSOR ) 25 MG tablet Take 1 tablet (25 mg total) by mouth 2 (two) times daily.   mometasone  (ELOCON ) 0.1 % cream Apply topically in the morning and at bedtime.   Multiple Vitamin (MULTIVITAMIN) tablet Take 1 tablet by mouth at bedtime.    Probiotic CAPS Take 2 capsules by mouth at bedtime.   [DISCONTINUED] metoprolol  tartrate (LOPRESSOR ) 25 MG tablet Take 1 tablet (25 mg total) by mouth 2 (two) times daily.   aspirin  325 MG tablet Take  325 mg by mouth daily. (Patient not taking: Reported on 09/19/2024)   colchicine  0.6 MG tablet Take 2 pills immediately and 1 more in 1 hour as needed for gout attack.  (Patient not taking: Reported on 09/19/2024)   No facility-administered encounter medications on file as of 09/19/2024.    History: Past Medical History:  Diagnosis Date   Atrial flutter (HCC)    Gout    Hypertension    Paroxysmal atrial fibrillation (HCC)    Venereal warts 08/29/2000   Vitamin D  deficiency 04/18/2008   Past Surgical History:  Procedure Laterality Date   ATRIAL FIBRILLATION ABLATION N/A 02/11/2020   Procedure: ATRIAL FIBRILLATION ABLATION;  Surgeon: Kelsie Agent, MD;  Location: [facility];  Service: Cardiovascular;  Laterality: N/A;   CATARACT EXTRACTION Right 08/2023   Dr. Patrcia   CATARACT EXTRACTION W/PHACO Left 03/11/2019   Procedure: CATARACT EXTRACTION PHACO AND INTRAOCULAR LENS PLACEMENT LEFT EYE;  Surgeon: Harrie Agent, MD;  Location: [facility];  Service: Ophthalmology;  Laterality: Left;  CDE: 11.60   COLONOSCOPY N/A 07/09/2019   Procedure: COLONOSCOPY;  Surgeon: Shaaron Lamar HERO, MD;  Location: [facility];  Service: Endoscopy;  Laterality: N/A;  10:30AM   POLYPECTOMY  07/09/2019   Procedure: POLYPECTOMY;  Surgeon: Shaaron Lamar HERO, MD;  Location: [facility];  Service: Endoscopy;;   TONSILLECTOMY     TUBAL LIGATION     Family History  Problem Relation Age of Onset   Alcohol abuse Father    Cancer Father 34       Pancreatic Cancer   Hypertension Sister    Hypertension Brother    Colon cancer Paternal Grandmother        greater than 64 years old   Social History   Occupational History   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 [employer]  Tobacco Use   Smoking status: Never   Smokeless tobacco: Never  Vaping Use   Vaping status: Never Used  Substance and Sexual Activity   Alcohol use: No   Drug use: No   Sexual activity: Not on file   Tobacco Counseling Counseling given: Not Answered  SDOH Screenings   Food Insecurity: No Food Insecurity (09/19/2024)  Housing: Low Risk (09/19/2024)  Transportation Needs: No Transportation Needs (09/19/2024)   Utilities: Not At Risk (09/19/2024)  Alcohol Screen: Low Risk (09/14/2023)  Depression (PHQ2-9): Low Risk (09/19/2024)  Financial Resource Strain: Low Risk (09/14/2023)  Physical Activity: Inactive (09/19/2024)  Social Connections: Moderately Isolated (09/19/2024)  Stress: No Stress Concern Present (09/19/2024)  Tobacco Use: Low Risk (09/19/2024)  Health Literacy: Adequate Health Literacy (09/19/2024)   See flowsheets for full screening details  Depression Screen PHQ 2 & 9 Depression Scale- Over the past 2 weeks, how often have you been bothered by any of the following problems? Little interest or pleasure in doing things: 0 Feeling down, depressed, or hopeless (PHQ Adolescent also includes...irritable): 0 PHQ-2 Total Score: 0     Goals Addressed             This Visit's Progress    Maintain health and independence   On track            Objective:    Today's Vitals   09/19/24 1352  BP: 130/76  Weight: 205 lb (93 kg)  Height: 5' 10 (1.778 m)   Body mass index is 29.41 kg/m.  Hearing/Vision screen No results found. Immunizations and Health Maintenance Health Maintenance  Topic Date Due   Zoster Vaccines- Shingrix (1 of 2) 12/04/1995  DTaP/Tdap/Td (2 - Td or Tdap) 06/28/2023   COVID-19 Vaccine (4 - 2025-26 season) 04/29/2024   Medicare Annual Wellness (AWV)  09/19/2025   Pneumococcal Vaccine: 50+ Years  Completed   Influenza Vaccine  Completed   Bone Density Scan  Completed   Hepatitis C Screening  Completed   Meningococcal B Vaccine  Aged Out   Mammogram  Discontinued   Colonoscopy  Discontinued        Assessment/Plan:  This is a routine wellness examination for [place].  Patient Care Team: Duanne Butler DASEN, MD as PCP - General (Family Medicine) Shaaron Lamar HERO, MD as Consulting Physician (Gastroenterology) Patrcia Sharper, MD as Consulting Physician (Ophthalmology) Mammography, [facility] (Diagnostic Radiology)  I have personally reviewed and noted the  following in the patients chart:   Medical and social history Use of alcohol, tobacco or illicit drugs  Current medications and supplements including opioid prescriptions. Functional ability and status Nutritional status Physical activity Advanced directives List of other physicians Hospitalizations, surgeries, and ER visits in previous 12 months Vitals Screenings to include cognitive, depression, and falls Referrals and appointments  No orders of the defined types were placed in this encounter.  In addition, I have reviewed and discussed with patient certain preventive protocols, quality metrics, and best practice recommendations. A written personalized care plan for preventive services as well as general preventive health recommendations were provided to patient.   Lavelle Charmaine Browner, [place]   [id number]/[id number]   Return in 1 year (on 09/19/2025).  After Visit Summary: (In Person-Printed) AVS printed and given to the patient  Nurse Notes: No voiced or noted concerns at this time Appointment(s) made: (10/03/24)  "

## 2024-10-03 ENCOUNTER — Ambulatory Visit: Admitting: Family Medicine

## 2024-10-17 ENCOUNTER — Ambulatory Visit: Admitting: Family Medicine
